# Patient Record
Sex: Female | Born: 1975
Health system: Southern US, Community
[De-identification: ages and names within clinical notes are randomized; demographics above are authoritative.]

## PROBLEM LIST (undated history)

## (undated) DIAGNOSIS — R51 Headache: Secondary | ICD-10-CM

## (undated) DIAGNOSIS — O24919 Unspecified diabetes mellitus in pregnancy, unspecified trimester: Secondary | ICD-10-CM

## (undated) DIAGNOSIS — O169 Unspecified maternal hypertension, unspecified trimester: Secondary | ICD-10-CM

## (undated) DIAGNOSIS — D649 Anemia, unspecified: Secondary | ICD-10-CM

---

## 2001-09-16 ENCOUNTER — Other Ambulatory Visit: Admission: RE | Admit: 2001-09-16 | Discharge: 2001-09-16 | Payer: Self-pay | Admitting: Obstetrics & Gynecology

## 2002-04-21 ENCOUNTER — Ambulatory Visit (HOSPITAL_COMMUNITY): Admission: RE | Admit: 2002-04-21 | Discharge: 2002-04-21 | Payer: Self-pay | Admitting: Internal Medicine

## 2002-04-21 ENCOUNTER — Encounter: Payer: Self-pay | Admitting: Internal Medicine

## 2002-04-27 ENCOUNTER — Encounter: Admission: RE | Admit: 2002-04-27 | Discharge: 2002-04-27 | Payer: Self-pay | Admitting: Internal Medicine

## 2002-04-27 ENCOUNTER — Encounter: Payer: Self-pay | Admitting: Internal Medicine

## 2002-09-22 ENCOUNTER — Other Ambulatory Visit: Admission: RE | Admit: 2002-09-22 | Discharge: 2002-09-22 | Payer: Self-pay | Admitting: Obstetrics & Gynecology

## 2003-10-03 ENCOUNTER — Other Ambulatory Visit: Admission: RE | Admit: 2003-10-03 | Discharge: 2003-10-03 | Payer: Self-pay | Admitting: Obstetrics & Gynecology

## 2004-09-30 ENCOUNTER — Ambulatory Visit (HOSPITAL_COMMUNITY): Admission: RE | Admit: 2004-09-30 | Discharge: 2004-09-30 | Payer: Self-pay | Admitting: Chiropractic Medicine

## 2005-08-19 DIAGNOSIS — O169 Unspecified maternal hypertension, unspecified trimester: Secondary | ICD-10-CM

## 2005-08-19 DIAGNOSIS — D649 Anemia, unspecified: Secondary | ICD-10-CM

## 2005-08-19 HISTORY — DX: Anemia, unspecified: D64.9

## 2005-08-19 HISTORY — PX: KNEE ARTHROSCOPY: SUR90

## 2005-08-19 HISTORY — DX: Unspecified maternal hypertension, unspecified trimester: O16.9

## 2005-11-04 ENCOUNTER — Other Ambulatory Visit: Admission: RE | Admit: 2005-11-04 | Discharge: 2005-11-04 | Payer: Self-pay | Admitting: Obstetrics and Gynecology

## 2006-02-07 ENCOUNTER — Ambulatory Visit (HOSPITAL_COMMUNITY): Admission: RE | Admit: 2006-02-07 | Discharge: 2006-02-07 | Payer: Self-pay | Admitting: Obstetrics and Gynecology

## 2006-04-11 ENCOUNTER — Inpatient Hospital Stay (HOSPITAL_COMMUNITY): Admission: AD | Admit: 2006-04-11 | Discharge: 2006-04-11 | Payer: Self-pay | Admitting: Obstetrics and Gynecology

## 2006-04-15 ENCOUNTER — Inpatient Hospital Stay (HOSPITAL_COMMUNITY): Admission: AD | Admit: 2006-04-15 | Discharge: 2006-04-19 | Payer: Self-pay | Admitting: Obstetrics and Gynecology

## 2006-04-16 ENCOUNTER — Encounter (INDEPENDENT_AMBULATORY_CARE_PROVIDER_SITE_OTHER): Payer: Self-pay | Admitting: Specialist

## 2006-04-20 ENCOUNTER — Encounter: Admission: RE | Admit: 2006-04-20 | Discharge: 2006-05-19 | Payer: Self-pay | Admitting: Obstetrics and Gynecology

## 2006-05-20 ENCOUNTER — Encounter: Admission: RE | Admit: 2006-05-20 | Discharge: 2006-06-19 | Payer: Self-pay | Admitting: Obstetrics and Gynecology

## 2006-06-20 ENCOUNTER — Encounter: Admission: RE | Admit: 2006-06-20 | Discharge: 2006-07-19 | Payer: Self-pay | Admitting: Obstetrics and Gynecology

## 2006-07-20 ENCOUNTER — Encounter: Admission: RE | Admit: 2006-07-20 | Discharge: 2006-08-19 | Payer: Self-pay | Admitting: Obstetrics and Gynecology

## 2006-08-01 ENCOUNTER — Ambulatory Visit (HOSPITAL_BASED_OUTPATIENT_CLINIC_OR_DEPARTMENT_OTHER): Admission: RE | Admit: 2006-08-01 | Discharge: 2006-08-01 | Payer: Self-pay | Admitting: Specialist

## 2006-11-09 ENCOUNTER — Emergency Department (HOSPITAL_COMMUNITY): Admission: EM | Admit: 2006-11-09 | Discharge: 2006-11-09 | Payer: Self-pay | Admitting: Emergency Medicine

## 2007-04-14 ENCOUNTER — Emergency Department (HOSPITAL_COMMUNITY): Admission: EM | Admit: 2007-04-14 | Discharge: 2007-04-14 | Payer: Self-pay | Admitting: Emergency Medicine

## 2011-01-04 NOTE — Op Note (Signed)
Emily Irwin, Emily Irwin          ACCOUNT NO.:  [id number]   MEDICAL RECORD NO.:  [id number]          PATIENT TYPE:  AMB   LOCATION:  NESC                         FACILITY:  [facility]   PHYSICIAN:  Jene Every, M.D.    DATE OF BIRTH:  11-Jul-1976   DATE OF PROCEDURE:  08/01/2006  DATE OF DISCHARGE:                               OPERATIVE REPORT   PREOPERATIVE DIAGNOSIS:  Medial meniscal tear, left knee.   POSTOPERATIVE DIAGNOSIS:  Medial meniscal tear, left knee,  chondromalacia patellofemoral joint.   PROCEDURE PERFORMED:  Left knee arthroscopy, posterior medial  meniscectomy, bucket handle tear, chondroplasty of the patellofemoral  joint.   ANESTHESIA:  General.   ASSISTANT:  None.   BRIEF HISTORY/INDICATIONS:  A 35 year old, bucket handle tear noted by  MRI.  Mechanical symptoms.  Operative intervention was indicated for  posterior medial meniscectomy.  The risks and benefits have been  discussed, including bleeding, infection, no change in symptoms,  worsening symptoms, need for repeat debridement in the future,  anesthetic complications, etc.   TECHNIQUE:  With the patient in the supine position, after induction of  adequate general anesthesia and 1 gm of Kefzol, the left lower extremity  was prepped and draped in the usual sterile fashion.  A lateral  parapatellar portal and the superior medial parapatellar portal was  fashioned with a #11 blade and egress cannula atraumatically placed.  Irrigant was utilized to insufflate the joint.  Under direct  visualization, a medial parapatellar portal was fashioned with a #11  blade after localization with an 18 gauge needle, sparing the medial  meniscus.  Noted immediately was a bucket handle tear of the medial  meniscus.  The anterior portion attached to the anterior aspect of the  medial meniscus.  Posterior portion of the posterior horn.  It was  truncated anteriorly.  The fragment was morcellized due to the  difficulty in  gaining access to the posterior horn.  I morselized with a  straight basket and rongeur, and then a 4.2, and then a 3.5 coude  shaver.  The remnant of the meniscus was also shaved.  It was the middle  half of the meniscus that was involved.  We were able to [facility] it  back to its posterior attachment, and the remnant was stable to probe  palpation.  There were minor grade 3 changes, of the patella, and  femoral sulcus was shaved.  Hypertrophic synovitis was shaved as well.  Exam of the lateral compartment was normal meniscus.  No tear.  Femoral  condyle and tibial plateau was unremarkable.  ACL and PCL were  unremarkable.  Normal patellofemoral tracking.  The gutters were  unremarkable, copiously lavaged.  Knee re-examined of medial meniscus.  No residual tear subluxating into the joint.  I copiously lavaged.  All  instrumentation was removed.  Portals were closed with 4-0 nylon and  simple sutures.  Marcaine 0.25% with epinephrine was infiltrated into  the joint wound sterilely.  Was awakened without difficulty and  transported to the recovery room in satisfactory condition.   Patient tolerated the procedure well with no complication.  Jene Every, M.D.  Electronically Signed     JB/MEDQ  D:  08/01/2006  T:  08/01/2006  Job:  [id number]

## 2011-01-04 NOTE — Discharge Summary (Signed)
NAMEGENELL, THEDE          ACCOUNT NO.:  [id number]   MEDICAL RECORD NO.:  [id number]          PATIENT TYPE:  INP   LOCATION:  9313                          FACILITY:  [facility]   PHYSICIAN:  Zenaida Niece, M.D.DATE OF BIRTH:  07/24/1976   DATE OF ADMISSION:  04/15/2006  DATE OF DISCHARGE:  04/19/2006                                 DISCHARGE SUMMARY   ADMISSION DIAGNOSES:  1. Intrauterine pregnancy at 37 weeks.  2. Preeclampsia.   DISCHARGE DIAGNOSES:  1. Intrauterine pregnancy at 37 weeks.  2. Preeclampsia.  3. Postpartum anemia with vaginal wall hematoma.   PROCEDURES:  On April 16, 2006, she had a spontaneous vaginal delivery with  a vaginal wall hematoma.   HISTORY AND PHYSICAL:  This is a 35 year old white female gravida 1, para 0  with an EGA of [redacted] weeks who presents for induction due to preeclampsia.  She  was seen in the office on the day of admission and found to have elevated  blood pressures and increased proteinuria.  Evaluation in triage revealed  persistent elevated blood pressures and proteinuria and she was admitted for  induction.  Prenatal care essentially uncomplicated other than the recent  elevated blood pressures and proteinuria.   PRENATAL LABORATORIES:  Blood type is A+ with a negative antibody screen.  Gonorrhea and chlamydia negative, RPR nonreactive, rubella immune, hepatitis  B surface antigen negative, HIV negative, group B strep negative.  She did  have an elevated risk of Down syndrome by screening, and the patient is a  cystic fibrosis carrier and dad is negative.   PAST HISTORY:  Essentially uncomplicated.   PHYSICAL EXAMINATION:  VITAL SIGNS:  Initial blood pressure here 136/83.  Fetal heart tracing is reactive with contractions every 5 minutes.  ABDOMEN:  Gravid, nontender.  EXTREMITIES:  The extremities have 2+ edema.  PELVIC:  The vaginal exam per Dr. Ellyn Hack 2, 50 and -2.   HOSPITAL COURSE:  The patient was admitted and placed  on magnesium for  seizure prophylaxis.  She was also placed on Pitocin for induction.  On the  afternoon of April 15, 2006, she progressed to 3-4 cm, and Dr. Ellyn Hack was  able to perform amniotomy also for induction.  She progressed slowly  throughout the night, and on the morning of August29, 2007 had a vaginal  delivery of a viable female infant with Apgars of 8 and 9 that weighed 9  pounds 5 ounces by Dr. Ambrose Mantle.  Delivery was performed over a left  mediolateral episiotomy.  Shoulder dystocia was encountered and treated with  McRoberts, wood screw and suprapubic pressure.  She did have uterine atony  which required Hemabate.  Her left mediolateral episiotomy was repaired with  3-0 Vicryl, and this did extend through the rectal sphincter which was re-  repaired with 2-0 Vicryl.  She also had an introital laceration repaired  with 3-0 Vicryl.  She then was noted to have a left vaginal wall hematoma  that remained stable at approximately 8 cm.  She was kept on magnesium  sulfate after delivery.  Hemoglobin fell from pre delivery of 9.7 to  immediately  post delivery of 7.4 and then 6.4.  Liver enzymes remained  slightly elevated.  Her vaginal wall hematoma remained stable.  She was kept  on magnesium sulfate until early on the morning of August31, 2007.  Blood  pressures remained stable, and she had excellent urine output.  Hemoglobin  on postpartum day #2 fell to 5.2.  She remained asymptomatic, and Dr. Ambrose Mantle  discussed this with the patient and she elected to avoid transfusion.  On  the afternoon of August31, 2007, she had one elevated blood pressure, and  PIH labs were repeated which revealed a hemoglobin of 6.2 and again slightly  elevated liver enzymes.  Repeat blood pressure was normal.  Blood pressures  remained stable.  On the morning of postpartum day #3, hemoglobin was 5.8  and AST was 67, ALT was 51 and these were stable.  She was having excellent  urine output.  She was felt to be  stable enough for discharge home at this  point.  The baby did end up being transferred to the neonatal intensive care  unit due to low blood sugars.   DISCHARGE INSTRUCTIONS:  Regular diet, pelvic rest, follow-up in 1 week for  blood pressure check.   MEDICATIONS:  1. Percocet #30 one to two p.o. q.4-6h. hours p.r.n. pain.  2. Over-the-counter ibuprofen as needed.  3. Over-the-counter iron b.i.d.  4. Over-the-counter stool softeners daily.  5. She is also given our discharge pamphlet.      Zenaida Niece, M.D.  Electronically Signed     TDM/MEDQ  D:  04/19/2006  T:  04/21/2006  Job:  [id number]

## 2011-06-11 LAB — OB RESULTS CONSOLE RPR: RPR: NONREACTIVE

## 2011-06-11 LAB — OB RESULTS CONSOLE HIV ANTIBODY (ROUTINE TESTING): HIV: NONREACTIVE

## 2011-06-11 LAB — OB RESULTS CONSOLE ABO/RH: RH Type: POSITIVE

## 2011-06-11 LAB — OB RESULTS CONSOLE HEPATITIS B SURFACE ANTIGEN: Hepatitis B Surface Ag: NEGATIVE

## 2011-06-11 LAB — OB RESULTS CONSOLE RUBELLA ANTIBODY, IGM: Rubella: IMMUNE

## 2011-11-06 ENCOUNTER — Encounter: Payer: BC Managed Care – PPO | Attending: Obstetrics and Gynecology | Admitting: Dietician

## 2011-11-06 DIAGNOSIS — O99810 Abnormal glucose complicating pregnancy: Secondary | ICD-10-CM | POA: Insufficient documentation

## 2011-11-06 DIAGNOSIS — Z713 Dietary counseling and surveillance: Secondary | ICD-10-CM | POA: Insufficient documentation

## 2011-11-07 ENCOUNTER — Encounter: Payer: Self-pay | Admitting: Dietician

## 2011-11-07 NOTE — Progress Notes (Signed)
  Patient was seen on 11/06/2011 for Gestational Diabetes self-management class at the [facility]. The following learning objectives were met by the patient during this course:   States the definition of Gestational Diabetes  States why dietary management is important in controlling blood glucose  Describes the effects each nutrient has on blood glucose levels  Demonstrates ability to create a balanced meal plan  Demonstrates carbohydrate counting   States when to check blood glucose levels  Demonstrates proper blood glucose monitoring techniques  States the effect of stress and exercise on blood glucose levels  States the importance of limiting caffeine and abstaining from alcohol and smoking  Blood glucose monitor given: Has her own glucose meter and has been monitoring for a week or so prior to coming to class Blood glucose reading: Fasting today was reported at 92 mg/dl  Patient instructed to monitor glucose levels:Fasting and 2 hours after the first bite of each meal FBS: 60 - <90 2 hour: <120  Patient received handouts:  Nutrition Diabetes and Pregnancy  Carbohydrate Counting List  Patient will be seen for follow-up as needed.

## 2011-11-19 ENCOUNTER — Encounter: Payer: Self-pay | Admitting: Obstetrics and Gynecology

## 2011-12-26 ENCOUNTER — Encounter (HOSPITAL_COMMUNITY): Payer: Self-pay

## 2012-01-06 ENCOUNTER — Encounter (HOSPITAL_COMMUNITY): Payer: Self-pay

## 2012-01-07 ENCOUNTER — Encounter (HOSPITAL_COMMUNITY): Payer: Self-pay

## 2012-01-07 ENCOUNTER — Encounter (HOSPITAL_COMMUNITY)
Admission: RE | Admit: 2012-01-07 | Discharge: 2012-01-07 | Disposition: A | Discharge status: Home / Self Care | Payer: BC Managed Care – PPO | Source: Ambulatory Visit | Attending: Obstetrics and Gynecology | Admitting: Obstetrics and Gynecology

## 2012-01-07 HISTORY — DX: Unspecified maternal hypertension, unspecified trimester: O16.9

## 2012-01-07 HISTORY — DX: Unspecified diabetes mellitus in pregnancy, unspecified trimester: O24.919

## 2012-01-07 HISTORY — DX: Headache: R51

## 2012-01-07 HISTORY — DX: Anemia, unspecified: D64.9

## 2012-01-07 LAB — SURGICAL PCR SCREEN: MRSA, PCR: NEGATIVE

## 2012-01-07 LAB — CBC
MCHC: 33.1 g/dL (ref 30.0–36.0)
RDW: 13 % (ref 11.5–15.5)

## 2012-01-07 LAB — RPR: RPR Ser Ql: NONREACTIVE

## 2012-01-07 NOTE — Pre-Procedure Instructions (Addendum)
Patient is expressing anxiety and concerns about infant not being monitored closely for blood sugar after birth. With her first baby, she states that his low blood sugar wasn't detected until he was showing symptoms and was sick.

## 2012-01-07 NOTE — Patient Instructions (Addendum)
YOUR PROCEDURE IS SCHEDULED ON: 01/14/12  ENTER THROUGH THE MAIN ENTRANCE OF [facility] AT:0600 am  USE DESK PHONE AND DIAL [id number] TO INFORM us OF YOUR ARRIVAL  CALL [phone] IF YOU HAVE ANY QUESTIONS OR PROBLEMS PRIOR TO YOUR ARRIVAL.  REMEMBER: DO NOT EAT OR DRINK AFTER MIDNIGHT :Monday  SPECIAL INSTRUCTIONS: water ok until 3:30 am   YOU MAY BRUSH YOUR TEETH THE MORNING OF SURGERY   TAKE THESE MEDICINES THE DAY OF SURGERY WITH SIP OF WATER:none   DO NOT WEAR JEWELRY, EYE MAKEUP, LIPSTICK OR DARK FINGERNAIL POLISH DO NOT WEAR BODY LOTIONS  DO NOT SHAVE FOR 48 HOURS PRIOR TO SURGERY  YOU WILL NOT BE ALLOWED TO DRIVE YOURSELF HOME.  NAME OF DRIVER:[employer]

## 2012-01-09 ENCOUNTER — Other Ambulatory Visit: Payer: Self-pay | Admitting: Obstetrics and Gynecology

## 2012-01-13 MED ORDER — DEXTROSE 5 % IV SOLN
2.0000 g | INTRAVENOUS | Status: DC
  Filled 2012-01-13: qty 2

## 2012-01-14 ENCOUNTER — Inpatient Hospital Stay (HOSPITAL_COMMUNITY): Payer: BC Managed Care – PPO | Admitting: Anesthesiology

## 2012-01-14 ENCOUNTER — Encounter (HOSPITAL_COMMUNITY): Payer: Self-pay | Admitting: General Surgery

## 2012-01-14 ENCOUNTER — Encounter (HOSPITAL_COMMUNITY): Payer: Self-pay | Admitting: Anesthesiology

## 2012-01-14 ENCOUNTER — Encounter (HOSPITAL_COMMUNITY)
Admission: RE | Disposition: A | Discharge status: Home / Self Care | Payer: Self-pay | Source: Ambulatory Visit | Attending: Obstetrics and Gynecology

## 2012-01-14 ENCOUNTER — Inpatient Hospital Stay (HOSPITAL_COMMUNITY)
Admission: RE | Admit: 2012-01-14 | Discharge: 2012-01-16 | DRG: 370 | Disposition: A | Discharge status: Home / Self Care | Payer: BC Managed Care – PPO | Source: Ambulatory Visit | Attending: Obstetrics and Gynecology | Admitting: Obstetrics and Gynecology

## 2012-01-14 DIAGNOSIS — O9903 Anemia complicating the puerperium: Secondary | ICD-10-CM | POA: Diagnosis not present

## 2012-01-14 DIAGNOSIS — D62 Acute posthemorrhagic anemia: Secondary | ICD-10-CM | POA: Diagnosis not present

## 2012-01-14 DIAGNOSIS — O34219 Maternal care for unspecified type scar from previous cesarean delivery: Principal | ICD-10-CM | POA: Diagnosis present

## 2012-01-14 DIAGNOSIS — Z302 Encounter for sterilization: Secondary | ICD-10-CM

## 2012-01-14 DIAGNOSIS — O99814 Abnormal glucose complicating childbirth: Secondary | ICD-10-CM | POA: Diagnosis present

## 2012-01-14 SURGERY — Surgical Case
Anesthesia: Spinal | Site: Abdomen | Wound class: Clean Contaminated

## 2012-01-14 MED ORDER — DIPHENHYDRAMINE HCL 50 MG/ML IJ SOLN: 12.5000 mg | INTRAMUSCULAR | Status: DC | PRN

## 2012-01-14 MED ORDER — ONDANSETRON HCL 4 MG/2ML IJ SOLN
INTRAMUSCULAR | Status: AC
  Filled 2012-01-14: qty 2

## 2012-01-14 MED ORDER — BISACODYL 10 MG RE SUPP: 10.0000 mg | Freq: Every day | RECTAL | Status: DC | PRN

## 2012-01-14 MED ORDER — METHYLERGONOVINE MALEATE 0.2 MG/ML IJ SOLN
INTRAMUSCULAR | Status: AC
  Filled 2012-01-14: qty 1

## 2012-01-14 MED ORDER — EPHEDRINE 5 MG/ML INJ
INTRAVENOUS | Status: AC
  Filled 2012-01-14: qty 10

## 2012-01-14 MED ORDER — KETOROLAC TROMETHAMINE 30 MG/ML IJ SOLN
INTRAMUSCULAR | Status: AC
  Administered 2012-01-14: 30 mg via INTRAVENOUS
  Filled 2012-01-14: qty 1

## 2012-01-14 MED ORDER — PHENYLEPHRINE 40 MCG/ML (10ML) SYRINGE FOR IV PUSH (FOR BLOOD PRESSURE SUPPORT)
PREFILLED_SYRINGE | INTRAVENOUS | Status: AC
  Filled 2012-01-14: qty 5

## 2012-01-14 MED ORDER — MEPERIDINE HCL 25 MG/ML IJ SOLN: 6.2500 mg | INTRAMUSCULAR | Status: DC | PRN

## 2012-01-14 MED ORDER — LANOLIN HYDROUS EX OINT: 1.0000 "application " | TOPICAL_OINTMENT | CUTANEOUS | Status: DC | PRN

## 2012-01-14 MED ORDER — SCOPOLAMINE 1 MG/3DAYS TD PT72
MEDICATED_PATCH | TRANSDERMAL | Status: AC
  Administered 2012-01-14: 1.5 mg via TRANSDERMAL
  Filled 2012-01-14: qty 1

## 2012-01-14 MED ORDER — SODIUM CHLORIDE 0.9 % IJ SOLN: 3.0000 mL | Freq: Two times a day (BID) | INTRAMUSCULAR | Status: DC

## 2012-01-14 MED ORDER — MORPHINE SULFATE (PF) 0.5 MG/ML IJ SOLN
INTRAMUSCULAR | Status: DC | PRN
  Administered 2012-01-14: .15 mg via INTRATHECAL

## 2012-01-14 MED ORDER — METHYLERGONOVINE MALEATE 0.2 MG/ML IJ SOLN
INTRAMUSCULAR | Status: DC | PRN
  Administered 2012-01-14: 0.2 mg via INTRAMUSCULAR

## 2012-01-14 MED ORDER — SODIUM CHLORIDE 0.9 % IV SOLN
1.0000 ug/kg/h | INTRAVENOUS | Status: DC | PRN
  Filled 2012-01-14: qty 2.5

## 2012-01-14 MED ORDER — KETOROLAC TROMETHAMINE 30 MG/ML IJ SOLN: 30.0000 mg | Freq: Four times a day (QID) | INTRAMUSCULAR | Status: AC | PRN

## 2012-01-14 MED ORDER — LACTATED RINGERS IV SOLN
INTRAVENOUS | Status: DC
  Administered 2012-01-14: 07:00:00 via INTRAVENOUS

## 2012-01-14 MED ORDER — DEXTROSE 5 % IV SOLN
2.0000 g | INTRAVENOUS | Status: DC | PRN
  Administered 2012-01-14: 2 g via INTRAVENOUS

## 2012-01-14 MED ORDER — SODIUM CHLORIDE 0.9 % IJ SOLN: 3.0000 mL | INTRAMUSCULAR | Status: DC | PRN

## 2012-01-14 MED ORDER — OXYTOCIN 10 UNIT/ML IJ SOLN
INTRAMUSCULAR | Status: AC
  Filled 2012-01-14: qty 2

## 2012-01-14 MED ORDER — IBUPROFEN 600 MG PO TABS
600.0000 mg | ORAL_TABLET | Freq: Four times a day (QID) | ORAL | Status: DC
  Administered 2012-01-15 – 2012-01-16 (×7): 600 mg via ORAL
  Filled 2012-01-14 (×8): qty 1

## 2012-01-14 MED ORDER — NALOXONE HCL 0.4 MG/ML IJ SOLN: 0.4000 mg | INTRAMUSCULAR | Status: DC | PRN

## 2012-01-14 MED ORDER — ZOLPIDEM TARTRATE 5 MG PO TABS: 5.0000 mg | ORAL_TABLET | Freq: Every evening | ORAL | Status: DC | PRN

## 2012-01-14 MED ORDER — EPHEDRINE SULFATE 50 MG/ML IJ SOLN
INTRAMUSCULAR | Status: DC | PRN
  Administered 2012-01-14 (×2): 10 mg via INTRAVENOUS
  Administered 2012-01-14: 5 mg via INTRAVENOUS
  Administered 2012-01-14: 10 mg via INTRAVENOUS

## 2012-01-14 MED ORDER — GLYCOPYRROLATE 0.2 MG/ML IJ SOLN
INTRAMUSCULAR | Status: DC | PRN
  Administered 2012-01-14: 0.2 mg via INTRAVENOUS

## 2012-01-14 MED ORDER — FENTANYL CITRATE 0.05 MG/ML IJ SOLN: 25.0000 ug | INTRAMUSCULAR | Status: DC | PRN

## 2012-01-14 MED ORDER — CEFAZOLIN SODIUM 1-5 GM-% IV SOLN
1.0000 g | Freq: Three times a day (TID) | INTRAVENOUS | Status: AC
  Administered 2012-01-14: 1 g via INTRAVENOUS
  Filled 2012-01-14 (×2): qty 50

## 2012-01-14 MED ORDER — ONDANSETRON HCL 4 MG/2ML IJ SOLN: 4.0000 mg | INTRAMUSCULAR | Status: DC | PRN

## 2012-01-14 MED ORDER — TETANUS-DIPHTH-ACELL PERTUSSIS 5-2.5-18.5 LF-MCG/0.5 IM SUSP
0.5000 mL | Freq: Once | INTRAMUSCULAR | Status: AC
Start: 2012-01-15 — End: 2012-01-15
  Administered 2012-01-15: 0.5 mL via INTRAMUSCULAR
  Filled 2012-01-14: qty 0.5

## 2012-01-14 MED ORDER — OXYCODONE-ACETAMINOPHEN 5-325 MG PO TABS
1.0000 | ORAL_TABLET | ORAL | Status: DC | PRN
  Administered 2012-01-14 – 2012-01-15 (×2): 1 via ORAL
  Administered 2012-01-15 (×2): 2 via ORAL
  Administered 2012-01-15 (×2): 1 via ORAL
  Administered 2012-01-15 – 2012-01-16 (×4): 2 via ORAL
  Filled 2012-01-14: qty 2
  Filled 2012-01-14 (×3): qty 1
  Filled 2012-01-14 (×4): qty 2
  Filled 2012-01-14: qty 1
  Filled 2012-01-14: qty 2

## 2012-01-14 MED ORDER — WITCH HAZEL-GLYCERIN EX PADS: 1.0000 "application " | MEDICATED_PAD | CUTANEOUS | Status: DC | PRN

## 2012-01-14 MED ORDER — LACTATED RINGERS IV SOLN
INTRAVENOUS | Status: DC | PRN
  Administered 2012-01-14 (×3): via INTRAVENOUS

## 2012-01-14 MED ORDER — PRENATAL MULTIVITAMIN CH
1.0000 | ORAL_TABLET | Freq: Every day | ORAL | Status: DC
  Administered 2012-01-15 – 2012-01-16 (×2): 1 via ORAL
  Filled 2012-01-14 (×2): qty 1

## 2012-01-14 MED ORDER — SIMETHICONE 80 MG PO CHEW: 80.0000 mg | CHEWABLE_TABLET | ORAL | Status: DC | PRN

## 2012-01-14 MED ORDER — FENTANYL CITRATE 0.05 MG/ML IJ SOLN
INTRAMUSCULAR | Status: DC | PRN
  Administered 2012-01-14: 25 ug via INTRATHECAL

## 2012-01-14 MED ORDER — METOCLOPRAMIDE HCL 5 MG/ML IJ SOLN: 10.0000 mg | Freq: Three times a day (TID) | INTRAMUSCULAR | Status: DC | PRN

## 2012-01-14 MED ORDER — BUPIVACAINE IN DEXTROSE 0.75-8.25 % IT SOLN
INTRATHECAL | Status: DC | PRN
  Administered 2012-01-14: 1.6 mL via INTRATHECAL

## 2012-01-14 MED ORDER — BUPIVACAINE HCL (PF) 0.25 % IJ SOLN
INTRAMUSCULAR | Status: DC | PRN
  Administered 2012-01-14: 10 mL

## 2012-01-14 MED ORDER — NALBUPHINE HCL 10 MG/ML IJ SOLN
5.0000 mg | INTRAMUSCULAR | Status: DC | PRN
  Filled 2012-01-14: qty 1

## 2012-01-14 MED ORDER — FERROUS SULFATE 325 (65 FE) MG PO TABS
325.0000 mg | ORAL_TABLET | Freq: Two times a day (BID) | ORAL | Status: DC
  Administered 2012-01-15 – 2012-01-16 (×3): 325 mg via ORAL
  Filled 2012-01-14 (×3): qty 1

## 2012-01-14 MED ORDER — MENTHOL 3 MG MT LOZG: 1.0000 | LOZENGE | OROMUCOSAL | Status: DC | PRN

## 2012-01-14 MED ORDER — OXYTOCIN 10 UNIT/ML IJ SOLN
40.0000 [IU] | INTRAVENOUS | Status: DC | PRN
  Administered 2012-01-14: 40 [IU] via INTRAVENOUS

## 2012-01-14 MED ORDER — DIPHENHYDRAMINE HCL 25 MG PO CAPS: 25.0000 mg | ORAL_CAPSULE | ORAL | Status: DC | PRN

## 2012-01-14 MED ORDER — IBUPROFEN 600 MG PO TABS: 600.0000 mg | ORAL_TABLET | Freq: Four times a day (QID) | ORAL | Status: DC | PRN

## 2012-01-14 MED ORDER — SIMETHICONE 80 MG PO CHEW
80.0000 mg | CHEWABLE_TABLET | Freq: Three times a day (TID) | ORAL | Status: DC
  Administered 2012-01-14 – 2012-01-16 (×7): 80 mg via ORAL

## 2012-01-14 MED ORDER — ONDANSETRON HCL 4 MG PO TABS: 4.0000 mg | ORAL_TABLET | ORAL | Status: DC | PRN

## 2012-01-14 MED ORDER — MORPHINE SULFATE 0.5 MG/ML IJ SOLN
INTRAMUSCULAR | Status: AC
  Filled 2012-01-14: qty 10

## 2012-01-14 MED ORDER — POLYETHYLENE GLYCOL 3350 17 G PO PACK
17.0000 g | PACK | Freq: Every day | ORAL | Status: DC
  Administered 2012-01-15 – 2012-01-16 (×2): 17 g via ORAL
  Filled 2012-01-14 (×4): qty 1

## 2012-01-14 MED ORDER — KETOROLAC TROMETHAMINE 30 MG/ML IJ SOLN
30.0000 mg | Freq: Four times a day (QID) | INTRAMUSCULAR | Status: AC | PRN
  Administered 2012-01-14 (×2): 30 mg via INTRAVENOUS
  Filled 2012-01-14: qty 1

## 2012-01-14 MED ORDER — SCOPOLAMINE 1 MG/3DAYS TD PT72
1.0000 | MEDICATED_PATCH | Freq: Once | TRANSDERMAL | Status: DC
  Administered 2012-01-14: 1.5 mg via TRANSDERMAL

## 2012-01-14 MED ORDER — BUPIVACAINE HCL (PF) 0.25 % IJ SOLN
INTRAMUSCULAR | Status: AC
  Filled 2012-01-14: qty 30

## 2012-01-14 MED ORDER — LIDOCAINE HCL (CARDIAC) 20 MG/ML IV SOLN
INTRAVENOUS | Status: DC | PRN
  Administered 2012-01-14: 40 mg via INTRAVENOUS

## 2012-01-14 MED ORDER — METHYLERGONOVINE MALEATE 0.2 MG/ML IJ SOLN
0.2000 mg | Freq: Four times a day (QID) | INTRAMUSCULAR | Status: DC
  Filled 2012-01-14: qty 1

## 2012-01-14 MED ORDER — SODIUM CHLORIDE 0.9 % IV SOLN
250.0000 mL | INTRAVENOUS | Status: DC
  Administered 2012-01-15: 1000 mL via INTRAVENOUS

## 2012-01-14 MED ORDER — SENNOSIDES-DOCUSATE SODIUM 8.6-50 MG PO TABS
2.0000 | ORAL_TABLET | Freq: Every day | ORAL | Status: DC
  Administered 2012-01-14 – 2012-01-15 (×2): 2 via ORAL

## 2012-01-14 MED ORDER — PHENYLEPHRINE HCL 10 MG/ML IJ SOLN
INTRAMUSCULAR | Status: DC | PRN
  Administered 2012-01-14: 40 ug via INTRAVENOUS
  Administered 2012-01-14 (×4): 80 ug via INTRAVENOUS

## 2012-01-14 MED ORDER — FLEET ENEMA 7-19 GM/118ML RE ENEM: 1.0000 | ENEMA | Freq: Every day | RECTAL | Status: DC | PRN

## 2012-01-14 MED ORDER — DIPHENHYDRAMINE HCL 50 MG/ML IJ SOLN: 25.0000 mg | INTRAMUSCULAR | Status: DC | PRN

## 2012-01-14 MED ORDER — DIBUCAINE 1 % RE OINT: 1.0000 "application " | TOPICAL_OINTMENT | RECTAL | Status: DC | PRN

## 2012-01-14 MED ORDER — DIPHENHYDRAMINE HCL 25 MG PO CAPS: 25.0000 mg | ORAL_CAPSULE | Freq: Four times a day (QID) | ORAL | Status: DC | PRN

## 2012-01-14 MED ORDER — OXYTOCIN 20 UNITS IN LACTATED RINGERS INFUSION - SIMPLE
INTRAVENOUS | Status: AC
  Administered 2012-01-14: 20 [IU] via INTRAVENOUS
  Filled 2012-01-14: qty 1000

## 2012-01-14 MED ORDER — METHYLERGONOVINE MALEATE 0.2 MG PO TABS
0.2000 mg | ORAL_TABLET | Freq: Four times a day (QID) | ORAL | Status: DC
  Administered 2012-01-14 – 2012-01-15 (×2): 0.2 mg via ORAL
  Filled 2012-01-14 (×2): qty 1

## 2012-01-14 MED ORDER — ONDANSETRON HCL 4 MG/2ML IJ SOLN
INTRAMUSCULAR | Status: DC | PRN
  Administered 2012-01-14: 2 mg via INTRAVENOUS
  Administered 2012-01-14: 4 mg via INTRAVENOUS
  Administered 2012-01-14: 2 mg via INTRAVENOUS

## 2012-01-14 MED ORDER — ONDANSETRON HCL 4 MG/2ML IJ SOLN: 4.0000 mg | Freq: Three times a day (TID) | INTRAMUSCULAR | Status: DC | PRN

## 2012-01-14 MED ORDER — METOCLOPRAMIDE HCL 5 MG/ML IJ SOLN: 10.0000 mg | Freq: Once | INTRAMUSCULAR | Status: DC | PRN

## 2012-01-14 MED ORDER — OXYTOCIN 20 UNITS IN LACTATED RINGERS INFUSION - SIMPLE
125.0000 mL/h | INTRAVENOUS | Status: AC
  Administered 2012-01-14: 20 [IU] via INTRAVENOUS

## 2012-01-14 MED ORDER — FENTANYL CITRATE 0.05 MG/ML IJ SOLN
INTRAMUSCULAR | Status: AC
  Filled 2012-01-14: qty 2

## 2012-01-14 SURGICAL SUPPLY — 25 items
CHLORAPREP W/TINT 26ML (MISCELLANEOUS) ×3 IMPLANT
CLOTH BEACON ORANGE TIMEOUT ST (SAFETY) ×3 IMPLANT
CONTAINER PREFILL 10% NBF 15ML (MISCELLANEOUS) ×6 IMPLANT
DRSG COVADERM 4X10 (GAUZE/BANDAGES/DRESSINGS) ×3 IMPLANT
ELECT REM PT RETURN 9FT ADLT (ELECTROSURGICAL) ×3
ELECTRODE REM PT RTRN 9FT ADLT (ELECTROSURGICAL) ×2 IMPLANT
GLOVE BIO SURGEON STRL SZ 6.5 (GLOVE) ×3 IMPLANT
GLOVE BIOGEL PI IND STRL 7.0 (GLOVE) ×4 IMPLANT
GLOVE BIOGEL PI INDICATOR 7.0 (GLOVE) ×2
NEEDLE HYPO 25X1 1.5 SAFETY (NEEDLE) ×3 IMPLANT
NS IRRIG 1000ML POUR BTL (IV SOLUTION) ×6 IMPLANT
PACK C SECTION WH (CUSTOM PROCEDURE TRAY) ×3 IMPLANT
PAD ABD 7.5X8 STRL (GAUZE/BANDAGES/DRESSINGS) ×3 IMPLANT
STAPLER VISISTAT 35W (STAPLE) ×3 IMPLANT
SUT CHROMIC GUT AB #0 18 (SUTURE) ×3 IMPLANT
SUT MNCRL 0 VIOLET CTX 36 (SUTURE) ×6 IMPLANT
SUT MONOCRYL 0 CTX 36 (SUTURE) ×3
SUT PLAIN 2 0 XLH (SUTURE) ×3 IMPLANT
SUT VIC AB 0 CT1 27 (SUTURE) ×6
SUT VIC AB 0 CT1 27XBRD ANBCTR (SUTURE) ×4 IMPLANT
SUT VIC AB 2-0 CT1 27 (SUTURE) ×2
SUT VIC AB 2-0 CT1 TAPERPNT 27 (SUTURE) ×4 IMPLANT
SYR CONTROL 10ML LL (SYRINGE) ×3 IMPLANT
TOWEL OR 17X24 6PK STRL BLUE (TOWEL DISPOSABLE) ×6 IMPLANT
TRAY FOLEY CATH 14FR (SET/KITS/TRAYS/PACK) ×3 IMPLANT

## 2012-01-14 NOTE — Anesthesia Procedure Notes (Signed)
Spinal  Patient location during procedure: OR Start time: 01/14/2012 7:36 AM Staffing Anesthesiologist: Vinnie Gombert A. Performed by: anesthesiologist  Preanesthetic Checklist Completed: patient identified, site marked, surgical consent, pre-op evaluation, timeout performed, IV checked, risks and benefits discussed and monitors and equipment checked Spinal Block Patient position: sitting Prep: site prepped and draped and DuraPrep Patient monitoring: heart rate, cardiac monitor, continuous pulse ox and blood pressure Approach: midline Location: L3-4 Injection technique: single-shot Needle Needle type: Sprotte  Needle gauge: 24 G Needle length: 9 cm Assessment Sensory level: T6 Events: paresthesia Additional Notes Patient tolerated procedure well. Transient paresthesia right leg. Adequate sensory level.

## 2012-01-14 NOTE — Transfer of Care (Signed)
Immediate Anesthesia Transfer of Care Note  Patient: Emily Irwin  Procedure(s) Performed: Procedure(s) (LRB): CESAREAN SECTION WITH BILATERAL TUBAL LIGATION ()  Patient Location: Mother/Baby  Anesthesia Type: Spinal  Level of Consciousness: awake, alert  and oriented  Airway & Oxygen Therapy: Patient Spontanous Breathing  Post-op Assessment: Report given to PACU RN and Post -op Vital signs reviewed and stable  Post vital signs: Reviewed and stable  Complications: No apparent anesthesia complications

## 2012-01-14 NOTE — Anesthesia Postprocedure Evaluation (Signed)
Anesthesia Post Note  Patient: Emily Irwin  Procedure(s) Performed: Procedure(s) (LRB): CESAREAN SECTION WITH BILATERAL TUBAL LIGATION ()  Anesthesia type: Spinal  Patient location: PACU  Post pain: Pain level controlled  Post assessment: Post-op Vital signs reviewed  Last Vitals:  Filed Vitals:   01/14/12 0930  BP: 111/61  Pulse: 77  Temp:   Resp: 20    Post vital signs: Reviewed  Level of consciousness: awake  Complications: No apparent anesthesia complications

## 2012-01-14 NOTE — Addendum Note (Signed)
Addendum  created 01/14/12 1706 by Christene Lye, CRNA   Modules edited:Anesthesia Events, Notes Section

## 2012-01-14 NOTE — Anesthesia Preprocedure Evaluation (Signed)
Anesthesia Evaluation  Patient identified by MRN, date of birth, ID band Patient awake    Reviewed: Allergy & Precautions, H&P , NPO status , Patient's Chart, lab work & pertinent test results  Airway Mallampati: II TM Distance: >3 FB Neck ROM: Full    Dental No notable dental hx. (+) Teeth Intact   Pulmonary neg pulmonary ROS,  breath sounds clear to auscultation  Pulmonary exam normal       Cardiovascular hypertension, negative cardio ROS  Rhythm:Regular Rate:Normal  Hx/o PIH   Neuro/Psych  Headaches, negative psych ROS   GI/Hepatic negative GI ROS, Neg liver ROS,   Endo/Other  Diabetes mellitus-, Well Controlled, Gestational  Renal/GU negative Renal ROS     Musculoskeletal   Abdominal Normal abdominal exam  (+)   Peds  Hematology negative hematology ROS (+)   Anesthesia Other Findings   Reproductive/Obstetrics (+) Pregnancy                           Anesthesia Physical Anesthesia Plan  ASA: II  Anesthesia Plan: Spinal   Post-op Pain Management:    Induction:   Airway Management Planned:   Additional Equipment:   Intra-op Plan:   Post-operative Plan:   Informed Consent: I have reviewed the patients History and Physical, chart, labs and discussed the procedure including the risks, benefits and alternatives for the proposed anesthesia with the patient or authorized representative who has indicated his/her understanding and acceptance.     Plan Discussed with: Anesthesiologist, CRNA and Surgeon  Anesthesia Plan Comments:         Anesthesia Quick Evaluation

## 2012-01-14 NOTE — Brief Op Note (Signed)
01/14/2012  8:38 AM  PATIENT:  Emily Irwin  36 y.o. female  PRE-OPERATIVE DIAGNOSIS:  Previous shoulder dystocia, term gestation, desires sterilization, Class A1 GDM  POST-OPERATIVE DIAGNOSIS:  Focal placenta accreta, previous shoulder dystocia, term gestation, Class A1 GDM, desires sterilization  PROCEDURE:  Procedure(s) (LRB):Primary  CESAREAN SECTION (N/A), Sharl Ma hysterotomy, Modified Pomeroy Tubal ligation SURGEON:  Surgeon(s) and Role:    * Velma Hanna Cathie Beams, MD - Primary  PHYSICIAN ASSISTANT:   ASSISTANTS: Marlinda Mike, CNM   ANESTHESIA:   spinal  FINDINGS: Live female w/ loop of cord presenting and around body, nl tubes and ovaries, Apgar 8/9. Focal accreta ant wall x 2 removed  EBL:  Total I/O In: 2000 [I.V.:2000] Out: 1100 [Urine:200; Blood:900]  BLOOD ADMINISTERED:none  DRAINS: none   LOCAL MEDICATIONS USED:  MARCAINE     SPECIMEN:  Source of Specimen:  portion of right and left fallopian tubes sent, placenta not sent  DISPOSITION OF SPECIMEN:  PATHOLOGY  COUNTS:  YES  TOURNIQUET:  * No tourniquets in log *  DICTATION: .Other Dictation: Dictation Number [id number]  PLAN OF CARE: Admit to inpatient   PATIENT DISPOSITION:  PACU - hemodynamically stable.   Delay start of Pharmacological VTE agent (>24hrs) due to surgical blood loss or risk of bleeding: no

## 2012-01-14 NOTE — Transfer of Care (Signed)
Immediate Anesthesia Transfer of Care Note  Patient: Emily Irwin  Procedure(s) Performed: Procedure(s) (LRB): CESAREAN SECTION (N/A)  Patient Location: PACU  Anesthesia Type: Spinal  Level of Consciousness: awake, alert  and oriented  Airway & Oxygen Therapy: Patient Spontanous Breathing  Post-op Assessment: Report given to PACU RN and Post -op Vital signs reviewed and stable  Post vital signs: Reviewed and stable  Complications: No apparent anesthesia complications

## 2012-01-14 NOTE — Op Note (Signed)
NAMELAKEIDRA, Emily Irwin NO.:  [id number]  MEDICAL RECORD NO.:  [id number]  LOCATION:  9126                          FACILITY:  [facility]  PHYSICIAN:  Maxie Better, M.D.DATE OF BIRTH:  [birth year]  DATE OF PROCEDURE:  01/14/2012 DATE OF DISCHARGE:                              OPERATIVE REPORT   PREOPERATIVE DIAGNOSES:  Previous shoulder dystocia, term gestation, class A1 gestational diabetes, desires sterilization.  POSTOPERATIVE DIAGNOSES:  Focal placenta accreta, class A1 gestational diabetes, previous shoulder dystocia, term gestation, desires sterilization.  PROCEDURE:  Primary cesarean section, Sharl Ma hysterotomy, modified Pomeroy tubal ligation.  ANESTHESIA:  Spinal.  SURGEON:  Maxie Better, MD  ASSISTANT:  Marlinda Mike, CNM  PROCEDURE:  Under adequate spinal anesthesia, the patient was placed in the supine position with a left lateral tilt.  She was sterilely prepped and draped in usual fashion.  Indwelling Foley catheter was sterilely placed.  A 0.25% Marcaine was injected at the planned Pfannenstiel incision site.  Pfannenstiel skin incision was then made, carried down to the rectus fascia.  Rectus fascia was opened transversely.  The rectus fascia was then bluntly and sharply dissected off the rectus muscle in superior and inferior fashion.  The rectus muscle was split in the midline.  The parietal peritoneum was entered sharply and extended. The vesicouterine peritoneum was opened transversely.  There was a large amount of vessels in the lower uterine segment.  The bladder was then bluntly dissected off the lower uterine segment and displaced inferiorly with a bladder retractor.  A curvilinear lower uterine transverse incision was made and extended with bandage scissors.  Artificial rupture of membranes occurred.  Subsequent loop of cord was noted in front of the head.  The fetal head was then replaced back in the uterine cavity and the cord  moved up superiorly.  The delivery was then re- attempted with subsequent delivery of a live female with cord around the body.  The cord was clamped, cut.  The baby was bulb suctioned on the abdomen.  The baby was transferred to the awaiting pediatrician,who subsequently assigned Apgars of 8 and 9 at 1 and 5 minutes.  The uterus was incised internally.  The placenta, however, was focally partially adherent to anterior uterine wall.  The patient has a history of postpartum hemorrhage with a prior delivery.  Therefore, the uterus was exteriorized with the placenta in place.  Double dose of intravenous Pitocin was infusing. The placenta was then manually removed.  As it was being removed, it was noted that part of the cotyledon was adherent to the myometrium and the placenta was finally removed. Inspection no of uterine cavity showed focal area of placental tissue x 2 noted adherent as well anteriorly.  Both of these with the exteriorization of the uterus was able to be seen and removed. The defect of the bigger size area in the myometrium was closed with 2-0 Vicryl interrupted sutures.  The cavity was explored.  No other areas of concern was noted.  At that point, the uterus was then returned back to the abdomen.  Uterine incision had no extension.  Normal tubes and ovaries were noted bilaterally.  The uterine incision was then closed in 2 layers, the  first layer with 0 Monocryl in a running locked stitch, second layer was imbricated using 0 Monocryl suture.  The abdomen was then copiously irrigated and suctioned of debris.  The ovaries were again noted to be normal.  The midportion of the left fallopian tube was grasped with a Babcock clamp.  The underlying mesosalpinx was opened with cautery.  The proximal and distal portion of the tube was then tied with 0 chromic sutures proximally and distally x2, and the same procedure was performed on the contralateral side with the midportion of both  fallopian tubes were then being removed.  The good hemostasis was noted on the uterine incision.  The parietal peritoneum was then closed with 2-0 Vicryl.  The rectus fascia was closed with 0 Vicryl x2.  The subcutaneous areas were irrigated, small bleeders were cauterized. Interrupted 2-0 plain suture was placed and the skin approximated with Ethicon staples.  Specimen labelled portion of right and left fallopian tubes were sent to pathology.  The placenta was not sent.  Estimated blood loss was 900 mL.  Intraoperative fluid was 2 liters.  Urine output was 200 mL, clear yellow urine.  Sponge and instrument counts x2 was correct.  Complications none.  The patient tolerated the procedure well, was transferred to recovery room in stable condition.     Maxie Better, M.D.     [place]/MEDQ  D:  01/14/2012  T:  01/14/2012  Job:  [id number]

## 2012-01-15 LAB — CBC
MCHC: 33.7 g/dL (ref 30.0–36.0)
RDW: 13.4 % (ref 11.5–15.5)
WBC: 8.3 10*3/uL (ref 4.0–10.5)

## 2012-01-15 MED ORDER — CEFAZOLIN SODIUM 1-5 GM-% IV SOLN
1.0000 g | Freq: Once | INTRAVENOUS | Status: AC
  Administered 2012-01-15: 1 g via INTRAVENOUS
  Filled 2012-01-15: qty 50

## 2012-01-15 NOTE — Progress Notes (Signed)
Patient ID: Emily Irwin, female   DOB: [birth year], 36 y.o.   MRN: [id number]  POSTOPERATIVE DAY # 1 S/P cesarean section with BTL   S:         Reports feeling well -mild itching only             Tolerating po intake / no nausea / no vomiting / no flatus / no BM             Bleeding is light             Pain controlled with motrin and percocet             Up ad lib / ambulatory  Newborn breast-feeding     O:  A & O x 3 NAD    VS: Blood pressure 122/63, pulse 69, temperature 98.3 F (36.8 C), temperature source Oral, resp. rate 20, weight 93.441 kg (206 lb), last menstrual period 04/13/2011, SpO2 96.00%, unknown if currently breastfeeding.   LABS: WBC/Hgb/Hct/Plts:  8.3/8.7/25.8/144 (05/29 0500)    Lungs: Clear and unlabored  Heart: regular rate and rhythm / no mumurs  Abdomen: soft, non-tender, non-distended, hypoactive BS             Fundus: firm, non-tender, U-1             Dressing intact without drainage              Perineum: no edema  Lochia: light  Extremities: trace edema, no calf pain or tenderness, negative Homans  A:        POD # 1 S/P cesarean section with BTL            Acute blood loss anemia  P:        Routine postoperative care              Start iron and colace             Consider early discharge     Marlinda Mike 01/15/2012, 9:21 AM

## 2012-01-16 ENCOUNTER — Encounter (HOSPITAL_COMMUNITY)
Admission: RE | Admit: 2012-01-16 | Discharge: 2012-01-16 | Disposition: A | Discharge status: Home / Self Care | Payer: BC Managed Care – PPO | Source: Ambulatory Visit | Attending: Obstetrics and Gynecology | Admitting: Obstetrics and Gynecology

## 2012-01-16 DIAGNOSIS — O923 Agalactia: Secondary | ICD-10-CM | POA: Insufficient documentation

## 2012-01-16 MED ORDER — IBUPROFEN 600 MG PO TABS: 600.0000 mg | ORAL_TABLET | Freq: Four times a day (QID) | ORAL | Status: AC

## 2012-01-16 MED ORDER — FERROUS SULFATE 325 (65 FE) MG PO TABS: 325.0000 mg | ORAL_TABLET | Freq: Two times a day (BID) | ORAL | Status: DC

## 2012-01-16 MED ORDER — OXYCODONE-ACETAMINOPHEN 5-325 MG PO TABS: 1.0000 | ORAL_TABLET | ORAL | Status: AC | PRN

## 2012-01-16 NOTE — Progress Notes (Signed)
Patient ID: ZI SEK, female   DOB: 1976/08/03, 36 y.o.   MRN: [id number]  POSTOPERATIVE DAY # 2 S/P cesarean section   S:         Reports feeling well - desires discharge today             Tolerating po intake / no nausea / no vomiting / + flatus / no BM             Bleeding is light             Pain controlled with motrin and percocet             Up ad lib / ambulatory  Newborn breast feeding     O:  A & O x 3  NAD             VS: Blood pressure 120/75, pulse 80, temperature 98 F (36.7 C), temperature source Oral, resp. rate 18, weight 93.441 kg (206 lb), last menstrual period 04/13/2011, SpO2 97.00%, unknown if currently breastfeeding.  Lungs: Clear and unlabored  Heart: regular rate and rhythm / no mumurs  Abdomen: soft, non-tender, non-distended, active BS             Fundus: firm, non-tender, U-1             Dressing OFF              Incision:  approximated with staples / no erythema / no ecchymosis / no drainage  Perineum: no edema  Lochia: light  Extremities: trace edema, no calf pain or tenderness, negative Homans  A:        POD # 2 S/P cesarean section with BTL            Stable status  P:        Routine postoperative care              Discharge home     Marlinda Mike CNM,MSN 01/16/2012, 9:29 AM

## 2012-01-16 NOTE — Discharge Summary (Signed)
POSTOPERATIVE DISCHARGE SUMMARY:  Patient ID: Emily Irwin MRN: [id number] DOB/AGE: 26-Jun-1976 35 y.o.  Admit date: 01/14/2012 Discharge date:  01/16/2012  Admission Diagnoses:  39 weeks previous cesarean section - elective repeat / undesired fertility    Discharge Diagnoses:   Term Pregnancy-delivered and POD cesarean section & BTL  Prenatal history: G2P1002   EDC : 01/18/2012, by Last Menstrual Period  Prenatal care at [facility]  Primary provider : Cousins Prenatal course complicated by previous cesarean section  Prenatal Labs: ABO, Rh: A (10/23 1300) positive Antibody: Negative (10/23 1300) Rubella: Immune (10/23 1300)  RPR: NON REACTIVE (05/21 0915)  HBsAg: Negative (10/23 1300)  HIV: Non-reactive (10/23 1300)  GBS:   negative 1 hr Glucola : nl   Medical / Surgical History :  Past medical history:  Past Medical History  Diagnosis Date  . Diabetes in pregnancy     DIET CONTROLLED  . Hypertension in pregnancy 2007  . Anemia 2007    hemorrhaged during delivery  . Headache     migraines    Past surgical history:  Past Surgical History  Procedure Date  . Knee arthroscopy 2007    Family History:  Family History  Problem Relation Age of Onset  . Diabetes Maternal Grandmother   . Diabetes Paternal Grandfather     Social History:  reports that she has never smoked. She has never used smokeless tobacco. She reports that she does not drink alcohol or use illicit drugs.   Allergies: Review of patient's allergies indicates no known allergies.    Current Medications at time of admission:  Prior to Admission medications   Medication Sig Start Date End Date Taking? Authorizing Provider  acetaminophen (TYLENOL) 500 MG tablet Take 1,000 mg by mouth daily as needed. Back pain   Yes Historical Provider, MD  polyethylene glycol (MIRALAX / GLYCOLAX) packet Take 17 g by mouth daily.   Yes Historical Provider, MD  Prenatal Vit-DSS-Fe Cbn-FA  (PRENATAL AD PO) Take 1 tablet by mouth daily.   Yes Historical Provider, MD  calcium carbonate (TUMS EX) 750 MG chewable tablet Chew 2 tablets by mouth 2 (two) times daily as needed. heartburn    Historical Provider, MD     Intrapartum Course: none  Procedures: Cesarean section delivery of female newborn by Dr Cherly Hensen  See operative report for further details  Postoperative / postpartum course: uneventful  Physical Exam:   VSS: Blood pressure 120/75, pulse 80, temperature 98 F (36.7 C), temperature source Oral, resp. rate 18, weight 93.441 kg (206 lb), last menstrual period 04/13/2011, SpO2 97.00%, unknown if currently breastfeeding.  LABS:   preop hgb 11.7 / post hgb 8.7  General: NAD / pleasant Heart: RR Lungs:clear Abdomen:soft / mildly distended / active BS Extremities:trace edema   Incision:  approximated with staples / no erythema / no ecchymosis / no drainage Staples: intact for removal at [facility] Monday ( Day 6)  Discharge Instructions:  Discharged Condition: stable Activity: pelvic rest Diet: routine Medications: see below Medication List  As of 01/16/2012  9:32 AM   TAKE these medications         acetaminophen 500 MG tablet   Commonly known as: TYLENOL   Take 1,000 mg by mouth daily as needed. Back pain      calcium carbonate 750 MG chewable tablet   Commonly known as: TUMS EX   Chew 2 tablets by mouth 2 (two) times daily as needed. heartburn      ibuprofen 600  MG tablet   Commonly known as: ADVIL,MOTRIN   Take 1 tablet (600 mg total) by mouth every 6 (six) hours.      oxyCODONE-acetaminophen 5-325 MG per tablet   Commonly known as: PERCOCET   Take 1-2 tablets by mouth every 3 (three) hours as needed (moderate - severe pain).      polyethylene glycol packet   Commonly known as: MIRALAX / GLYCOLAX   Take 17 g by mouth daily.      PRENATAL AD PO   Take 1 tablet by mouth daily.      ZOFRAN 4 MG tablet   Generic drug: ondansetron   Take 4 mg by mouth  every 12 (twelve) hours as needed. nausea                     Iron 325 mg (OTC tablet) twice daily x 6 weeks            Colace 100 mg (OTC) 1-2 times daily for stool softener with iron       Condition: stable Postpartum Instructions: refer to practice specific booklet Discharge to: home Disposition:  Follow up :  Follow-up Information    Follow up with COUSINS,SHERONETTE A, MD. Schedule an appointment as soon as possible for a visit in 6 weeks. (staple removal at [facility] Monday)    Contact information:   [address] [place] [address] [phone]           Signed: Marlinda Mike CNM,MSN 01/16/2012, 9:32 AM

## 2012-02-16 ENCOUNTER — Encounter (HOSPITAL_COMMUNITY)
Admission: RE | Admit: 2012-02-16 | Discharge: 2012-02-16 | Disposition: A | Discharge status: Home / Self Care | Payer: BC Managed Care – PPO | Source: Ambulatory Visit | Attending: Obstetrics and Gynecology | Admitting: Obstetrics and Gynecology

## 2012-02-16 DIAGNOSIS — O923 Agalactia: Secondary | ICD-10-CM | POA: Insufficient documentation

## 2012-03-18 ENCOUNTER — Encounter (HOSPITAL_COMMUNITY)
Admission: RE | Admit: 2012-03-18 | Discharge: 2012-03-18 | Disposition: A | Discharge status: Home / Self Care | Payer: BC Managed Care – PPO | Source: Ambulatory Visit | Attending: Obstetrics and Gynecology | Admitting: Obstetrics and Gynecology

## 2012-03-18 DIAGNOSIS — O923 Agalactia: Secondary | ICD-10-CM | POA: Insufficient documentation

## 2012-04-18 ENCOUNTER — Encounter (HOSPITAL_COMMUNITY)
Admission: RE | Admit: 2012-04-18 | Discharge: 2012-04-18 | Disposition: A | Discharge status: Home / Self Care | Payer: BC Managed Care – PPO | Source: Ambulatory Visit | Attending: Obstetrics and Gynecology | Admitting: Obstetrics and Gynecology

## 2012-04-18 DIAGNOSIS — O923 Agalactia: Secondary | ICD-10-CM | POA: Insufficient documentation

## 2012-05-19 ENCOUNTER — Encounter (HOSPITAL_COMMUNITY)
Admission: RE | Admit: 2012-05-19 | Discharge: 2012-05-19 | Disposition: A | Discharge status: Home / Self Care | Payer: BC Managed Care – PPO | Source: Ambulatory Visit | Attending: Obstetrics and Gynecology | Admitting: Obstetrics and Gynecology

## 2012-05-19 DIAGNOSIS — O923 Agalactia: Secondary | ICD-10-CM | POA: Insufficient documentation

## 2012-06-19 ENCOUNTER — Encounter (HOSPITAL_COMMUNITY)
Admission: RE | Admit: 2012-06-19 | Discharge: 2012-06-19 | Disposition: A | Discharge status: Home / Self Care | Payer: BC Managed Care – PPO | Source: Ambulatory Visit | Attending: Obstetrics and Gynecology | Admitting: Obstetrics and Gynecology

## 2012-06-19 DIAGNOSIS — O923 Agalactia: Secondary | ICD-10-CM | POA: Insufficient documentation

## 2012-07-19 ENCOUNTER — Encounter (HOSPITAL_COMMUNITY)
Admission: RE | Admit: 2012-07-19 | Discharge: 2012-07-19 | Disposition: A | Discharge status: Home / Self Care | Payer: BC Managed Care – PPO | Source: Ambulatory Visit | Attending: Obstetrics and Gynecology | Admitting: Obstetrics and Gynecology

## 2012-07-19 DIAGNOSIS — O923 Agalactia: Secondary | ICD-10-CM | POA: Insufficient documentation

## 2014-06-20 ENCOUNTER — Encounter (HOSPITAL_COMMUNITY): Payer: Self-pay | Admitting: General Surgery

## 2014-07-11 ENCOUNTER — Encounter: Payer: Self-pay | Admitting: Physician Assistant

## 2014-07-11 ENCOUNTER — Ambulatory Visit (INDEPENDENT_AMBULATORY_CARE_PROVIDER_SITE_OTHER): Payer: BC Managed Care – PPO | Admitting: Physician Assistant

## 2014-07-11 VITALS — BP 134/65 | HR 85 | Ht 66.0 in | Wt 192.0 lb

## 2014-07-11 DIAGNOSIS — O139 Gestational [pregnancy-induced] hypertension without significant proteinuria, unspecified trimester: Secondary | ICD-10-CM | POA: Insufficient documentation

## 2014-07-11 DIAGNOSIS — Z131 Encounter for screening for diabetes mellitus: Secondary | ICD-10-CM

## 2014-07-11 DIAGNOSIS — E669 Obesity, unspecified: Secondary | ICD-10-CM | POA: Diagnosis not present

## 2014-07-11 DIAGNOSIS — O24410 Gestational diabetes mellitus in pregnancy, diet controlled: Secondary | ICD-10-CM | POA: Diagnosis not present

## 2014-07-11 DIAGNOSIS — Z23 Encounter for immunization: Secondary | ICD-10-CM

## 2014-07-11 DIAGNOSIS — Z0000 Encounter for general adult medical examination without abnormal findings: Secondary | ICD-10-CM | POA: Diagnosis not present

## 2014-07-11 DIAGNOSIS — O24419 Gestational diabetes mellitus in pregnancy, unspecified control: Secondary | ICD-10-CM | POA: Insufficient documentation

## 2014-07-11 DIAGNOSIS — O133 Gestational [pregnancy-induced] hypertension without significant proteinuria, third trimester: Secondary | ICD-10-CM | POA: Diagnosis not present

## 2014-07-11 DIAGNOSIS — Z13220 Encounter for screening for lipoid disorders: Secondary | ICD-10-CM

## 2014-07-11 MED ORDER — PHENTERMINE HCL 37.5 MG PO TABS: 37.5000 mg | ORAL_TABLET | Freq: Every day | ORAL | Status: DC

## 2014-07-11 NOTE — Patient Instructions (Signed)
cKeeping You Healthy  Get These Tests 1. Blood Pressure- Have your blood pressure checked once a year by your health care provider.  Normal blood pressure is 120/80. 2. Weight- Have your body mass index (BMI) calculated to screen for obesity.  BMI is measure of body fat based on height and weight.  You can also calculate your own BMI at GravelBags.it. 3. Cholesterol- Have your cholesterol checked every 5 years starting at age 38 then yearly starting at age 69. 20. Chlamydia, HIV, and other sexually transmitted diseases- Get screened every year until age 84, then within three months of each new sexual provider. 5. Pap Smear- Every 1-3 years; discuss with your health care provider. 6. Mammogram- Every year starting at age 75  Take these medicines  Calcium with Vitamin D-Your body needs 1200 mg of Calcium each day and [phone] IU of Vitamin D daily.  Your body can only absorb 500 mg of Calcium at a time so Calcium must be taken in 2 or 3 divided doses throughout the day.  Multivitamin with folic acid- Once daily if it is possible for you to become pregnant.  Get these Immunizations  Gardasil-Series of three doses; prevents HPV related illness such as genital warts and cervical cancer.  Menactra-Single dose; prevents meningitis.  Tetanus shot- Every 10 years.  Flu shot-Every year.  Take these steps 1. Do not smoke-Your healthcare provider can help you quit.  For tips on how to quit go to www.smokefree.gov or call 1-800 QUITNOW. 2. Be physically active- Exercise 5 days a week for at least 30 minutes.  If you are not already physically active, start slow and gradually work up to 30 minutes of moderate physical activity.  Examples of moderate activity include walking briskly, dancing, swimming, bicycling, etc. 3. Breast Cancer- A self breast exam every month is important for early detection of breast cancer.  For more information and instruction on self breast exams, ask your  healthcare provider or https://www.patel.info/. 4. Eat a healthy diet- Eat a variety of healthy foods such as fruits, vegetables, whole grains, low fat milk, low fat cheeses, yogurt, lean meats, poultry and fish, beans, nuts, tofu, etc.  For more information go to www. Thenutritionsource.org 5. Drink alcohol in moderation- Limit alcohol intake to one drink or less per day. Never drink and drive. 6. Depression- Your emotional health is as important as your physical health.  If you're feeling down or losing interest in things you normally enjoy please talk to your healthcare provider about being screened for depression. 7. Dental visit- Brush and floss your teeth twice daily; visit your dentist twice a year. 8. Eye doctor- Get an eye exam at least every 2 years. 9. Helmet use- Always wear a helmet when riding a bicycle, motorcycle, rollerblading or skateboarding. 57. Safe sex- If you may be exposed to sexually transmitted infections, use a condom. 11. Seat belts- Seat belts can save your live; always wear one. 12. Smoke/Carbon Monoxide detectors- These detectors need to be installed on the appropriate level of your home. Replace batteries at least once a year. 13. Skin cancer- When out in the sun please cover up and use sunscreen 15 SPF or higher. 14. Violence- If anyone is threatening or hurting you, please tell your healthcare provider.

## 2014-07-12 NOTE — Progress Notes (Signed)
  Subjective:     Emily Irwin is a 38 y.o. female and is here for a comprehensive physical exam. The patient reports no problems but she is really wanting to lose weight. had 2 children and just not able to get to goal/healthy weight. . Pt is also a new patient.   .. Active Ambulatory Problems    Diagnosis Date Noted  . Gestational diabetes mellitus 07/11/2014  . Gestational hypertension 07/11/2014  . Obese 07/11/2014   Resolved Ambulatory Problems    Diagnosis Date Noted  . Postpartum care following cesarean delivery and BTL (5/28) 01/14/2012   Past Medical History  Diagnosis Date  . Diabetes in pregnancy   . Hypertension in pregnancy 2007  . Anemia 2007  . Headache(784.0)      History   Social History  . Marital Status: Married    Spouse Name: N/A    Number of Children: N/A  . Years of Education: N/A   Occupational History  . Not on file.   Social History Main Topics  . Smoking status: Never Smoker   . Smokeless tobacco: Never Used  . Alcohol Use: Yes  . Drug Use: No  . Sexual Activity: Yes   Other Topics Concern  . Not on file   Social History Narrative   Health Maintenance  Topic Date Due  . PAP SMEAR  02/14/1994  . INFLUENZA VACCINE  03/20/2015  . TETANUS/TDAP  01/14/2022    The following portions of the patient's history were reviewed and updated as appropriate: allergies, current medications, past family history, past medical history, past social history, past surgical history and problem list.  Review of Systems A comprehensive review of systems was negative.   Objective:    BP 134/65 mmHg  Pulse 85  Ht 5\' 6"  (1.676 m)  Wt 192 lb (87.091 kg)  BMI 31.00 kg/m2  LMP 07/03/2014 General appearance: alert, cooperative, appears stated age and moderately obese Head: Normocephalic, without obvious abnormality, atraumatic Eyes: conjunctivae/corneas clear. PERRL, EOM's intact. Fundi benign. Ears: normal TM's and external ear canals both  ears Nose: Nares normal. Septum midline. Mucosa normal. No drainage or sinus tenderness. Throat: lips, mucosa, and tongue normal; teeth and gums normal Neck: no adenopathy, no carotid bruit, no JVD, supple, symmetrical, trachea midline and thyroid not enlarged, symmetric, no tenderness/mass/nodules Back: symmetric, no curvature. ROM normal. No CVA tenderness. Lungs: clear to auscultation bilaterally Heart: regular rate and rhythm, S1, S2 normal, no murmur, click, rub or gallop Abdomen: soft, non-tender; bowel sounds normal; no masses,  no organomegaly Extremities: extremities normal, atraumatic, no cyanosis or edema Pulses: 2+ and symmetric Skin: Skin color, texture, turgor normal. No rashes or lesions Lymph nodes: Cervical, supraclavicular, and axillary nodes normal. Neurologic: Grossly normal    Assessment:    Healthy female exam.      Plan:    CPE- discussed needs pap smear and GYN exam. She will schedule. Fasting labs were given to patient. Flu shot given today. Recommended calcium 1200mg  and vitamin D 800mg  daily. Discussed weight loss and healthy diet plan.   Obese- started phentermine. Pt has tried before. Discussed side effects. Pt aware to follow up in 1 month. Discussed 1500 calorie diet to lose weight and to get at least 192min of exercise a week.  See After Visit Summary for Counseling Recommendations

## 2014-08-15 ENCOUNTER — Encounter: Payer: Self-pay | Admitting: Physician Assistant

## 2014-08-15 ENCOUNTER — Ambulatory Visit (INDEPENDENT_AMBULATORY_CARE_PROVIDER_SITE_OTHER): Payer: BC Managed Care – PPO | Admitting: Physician Assistant

## 2014-08-15 VITALS — BP 114/75 | HR 82 | Ht 66.0 in | Wt 184.0 lb

## 2014-08-15 DIAGNOSIS — E663 Overweight: Secondary | ICD-10-CM

## 2014-08-15 DIAGNOSIS — R635 Abnormal weight gain: Secondary | ICD-10-CM

## 2014-08-15 LAB — COMPLETE METABOLIC PANEL WITH GFR
ALBUMIN: 4.3 g/dL (ref 3.5–5.2)
ALK PHOS: 34 U/L — AB (ref 39–117)
ALT: 11 U/L (ref 0–35)
AST: 13 U/L (ref 0–37)
BUN: 7 mg/dL (ref 6–23)
CALCIUM: 9.2 mg/dL (ref 8.4–10.5)
CHLORIDE: 103 meq/L (ref 96–112)
CO2: 25 meq/L (ref 19–32)
Creat: 0.7 mg/dL (ref 0.50–1.10)
GFR, Est African American: >89 mL/min
GLUCOSE: 99 mg/dL (ref 70–99)
POTASSIUM: 4.2 meq/L (ref 3.5–5.3)
SODIUM: 138 meq/L (ref 135–145)
TOTAL PROTEIN: 6.5 g/dL (ref 6.0–8.3)
Total Bilirubin: 0.5 mg/dL (ref 0.2–1.2)

## 2014-08-15 LAB — LIPID PANEL
CHOLESTEROL: 161 mg/dL (ref 0–200)
HDL: 54 mg/dL (ref >39)
LDL CALC: 89 mg/dL (ref 0–99)
TRIGLYCERIDES: 91 mg/dL (ref <150)
Total CHOL/HDL Ratio: 3 Ratio
VLDL: 18 mg/dL (ref 0–40)

## 2014-08-15 MED ORDER — PHENTERMINE HCL 37.5 MG PO TABS: 37.5000 mg | ORAL_TABLET | Freq: Every day | ORAL | Status: DC

## 2014-08-15 NOTE — Progress Notes (Signed)
   Subjective:    Patient ID: Emily Irwin, female    DOB: [birth year], 38 y.o.   MRN: [id number]  HPI Pt presents to the clinic to follow up on weight with phentermine. She has lost 8lbs this is the first month.. She has noticed her appetitie is certainly decreased. She is eating around 1200 to 1500 calories a day. She is not exercising. She is having some constipation but controlled with OTC miralax and colace. Denies any palpitations, insomnia or concerning side effects.    Review of Systems  All other systems reviewed and are negative.      Objective:   Physical Exam  Constitutional: She is oriented to person, place, and time. She appears well-developed and well-nourished.  HENT:  Head: Normocephalic and atraumatic.  Cardiovascular: Normal rate, regular rhythm and normal heart sounds.   Neurological: She is alert and oriented to person, place, and time.  Psychiatric: She has a normal mood and affect. Her behavior is normal.          Assessment & Plan:  Overweight/abnormal weight gain- refilled phentermine. Discussed if side effects are too much cut in half. Encouraged pt to start some type of exercise plan. Continue 1200-1500 calorie diet. Follow up in 1 month next visit. Vitals look great today.

## 2014-08-16 LAB — HEMOGLOBIN A1C
HEMOGLOBIN A1C: 5.5 % (ref <5.7)
MEAN PLASMA GLUCOSE: 111 mg/dL (ref <117)

## 2014-09-12 ENCOUNTER — Encounter: Payer: BC Managed Care – PPO | Admitting: Physician Assistant

## 2014-09-23 ENCOUNTER — Ambulatory Visit (INDEPENDENT_AMBULATORY_CARE_PROVIDER_SITE_OTHER): Payer: BLUE CROSS/BLUE SHIELD | Admitting: Physician Assistant

## 2014-09-23 VITALS — BP 117/69 | HR 83 | Ht 66.0 in | Wt 176.0 lb

## 2014-09-23 DIAGNOSIS — R635 Abnormal weight gain: Secondary | ICD-10-CM

## 2014-09-23 MED ORDER — PHENTERMINE HCL 37.5 MG PO TABS: 37.5000 mg | ORAL_TABLET | Freq: Every day | ORAL | Status: DC

## 2014-09-23 NOTE — Progress Notes (Signed)
Patient doing well on appetite suppressant. Here for nurse visit, weight, BP, HR check. Denies problems with insomnia, heart palpitations or tremors. Satisfied with weight loss thus far and is working on [employer] and regular exercise.  Emily Irwin, [name]

## 2014-10-21 ENCOUNTER — Ambulatory Visit: Payer: BLUE CROSS/BLUE SHIELD

## 2014-11-04 ENCOUNTER — Ambulatory Visit: Payer: BLUE CROSS/BLUE SHIELD

## 2014-11-10 ENCOUNTER — Ambulatory Visit (INDEPENDENT_AMBULATORY_CARE_PROVIDER_SITE_OTHER): Payer: BLUE CROSS/BLUE SHIELD | Admitting: Family Medicine

## 2014-11-10 VITALS — BP 125/85 | HR 100 | Ht 66.0 in | Wt 173.0 lb

## 2014-11-10 DIAGNOSIS — R635 Abnormal weight gain: Secondary | ICD-10-CM

## 2014-11-10 MED ORDER — PHENTERMINE HCL 37.5 MG PO TABS: 37.5000 mg | ORAL_TABLET | Freq: Every day | ORAL | Status: DC

## 2014-11-10 NOTE — Progress Notes (Signed)
   Subjective:    Patient ID: Emily Irwin, female    DOB: 02/20/1976, 39 y.o.   MRN: [id number]  HPI Patient is here for blood pressure and weight check. Denies any trouble sleeping, palpitations, or any other medication problems. Patient states she has made some dietary changes but went on two vacations ([place] and [name], [place]) and did not stick to her diet during those times.   Review of Systems     Objective:   Physical Exam        Assessment & Plan:  Patient has some weight loss. A refill for Phentermine will be sent to patient preferred pharmacy (she changed which location she would like to use). Patient advised to schedule a four week nurse visit and keep her upcoming appointment with her PCP. Verbalized understanding, no further questions.  Abnormal weight gain-down 3 pounds. Phentermine refilled.  Beatrice Lecher, MD

## 2014-11-15 ENCOUNTER — Ambulatory Visit: Payer: BLUE CROSS/BLUE SHIELD

## 2014-12-09 ENCOUNTER — Ambulatory Visit: Payer: BLUE CROSS/BLUE SHIELD

## 2014-12-12 ENCOUNTER — Ambulatory Visit (INDEPENDENT_AMBULATORY_CARE_PROVIDER_SITE_OTHER): Payer: BLUE CROSS/BLUE SHIELD | Admitting: Physician Assistant

## 2014-12-12 VITALS — BP 125/82 | HR 79 | Wt 173.0 lb

## 2014-12-12 DIAGNOSIS — R635 Abnormal weight gain: Secondary | ICD-10-CM

## 2014-12-12 MED ORDER — PHENTERMINE HCL 37.5 MG PO TABS: 37.5000 mg | ORAL_TABLET | Freq: Every day | ORAL | Status: DC

## 2014-12-12 NOTE — Progress Notes (Signed)
Patient came in today for BP & weight check.  Patient concern is that she doesn't she seem to be losing weight &   that she may have hit a plato. She stated that she is no longer doing the 6 small meals but now just 3 meals a day.  Blood pressure  Is doing well.    Abnormal weight gain- refilled for one month. If does not lose any weight needs to discuss nutrition referral and evaluation. Iran Planas PA-C

## 2014-12-15 ENCOUNTER — Telehealth: Payer: Self-pay | Admitting: Physician Assistant

## 2014-12-15 NOTE — Telephone Encounter (Signed)
Received fax for pa on phentermine sent through cover my meds waiting on auth. - CF

## 2014-12-19 NOTE — Telephone Encounter (Signed)
Medication was denied due to not meeting definition of medical necessity - CF

## 2015-01-12 ENCOUNTER — Ambulatory Visit (INDEPENDENT_AMBULATORY_CARE_PROVIDER_SITE_OTHER): Payer: BLUE CROSS/BLUE SHIELD | Admitting: Family Medicine

## 2015-01-12 VITALS — BP 136/89 | HR 65 | Wt 171.0 lb

## 2015-01-12 DIAGNOSIS — R635 Abnormal weight gain: Secondary | ICD-10-CM

## 2015-01-12 DIAGNOSIS — Z6827 Body mass index (BMI) 27.0-27.9, adult: Secondary | ICD-10-CM

## 2015-01-12 MED ORDER — PHENTERMINE HCL 37.5 MG PO TABS: 37.5000 mg | ORAL_TABLET | Freq: Every day | ORAL | Status: DC

## 2015-01-12 NOTE — Progress Notes (Signed)
   Subjective:    Patient ID: Emily Irwin, female    DOB: 1975/09/06, 39 y.o.   MRN: [id number]  HPI Patient is here for blood pressure and weight check. Denies any trouble sleeping, palpitations, or any other medication problems. Patient states she has tried to work on dietary changes but has forgotten to take the Rx a couple days over the last month.    Review of Systems     Objective:   Physical Exam        Assessment & Plan:  Patient has lost some weight. A refill for Phentermine will be sent to patient preferred pharmacy. Patient advised to schedule a four week nurse visit and keep her upcoming appointment with her PCP. Verbalized understanding, no further questions.

## 2015-01-12 NOTE — Progress Notes (Signed)
   Subjective:    Patient ID: Emily Irwin, female    DOB: Apr 12, 1976, 39 y.o.   MRN: [id number]  HPI Abnormal weight gain -  she's lost 2 pounds and is doing well. Blood pressure is a little borderline today so we need to keep a close eye on that. Follow-up in 4 weeks. Medication refill. Beatrice Lecher, MD    Review of Systems     Objective:   Physical Exam        Assessment & Plan:

## 2015-05-01 NOTE — Progress Notes (Signed)
This encounter was created in error - please disregard.

## 2015-09-08 ENCOUNTER — Encounter: Payer: Self-pay | Admitting: Physician Assistant

## 2015-09-08 ENCOUNTER — Ambulatory Visit (INDEPENDENT_AMBULATORY_CARE_PROVIDER_SITE_OTHER): Payer: BLUE CROSS/BLUE SHIELD | Admitting: Physician Assistant

## 2015-09-08 VITALS — BP 116/42 | HR 48 | Ht 66.0 in | Wt 183.0 lb

## 2015-09-08 DIAGNOSIS — Z13220 Encounter for screening for lipoid disorders: Secondary | ICD-10-CM | POA: Diagnosis not present

## 2015-09-08 DIAGNOSIS — Z0000 Encounter for general adult medical examination without abnormal findings: Secondary | ICD-10-CM | POA: Diagnosis not present

## 2015-09-08 DIAGNOSIS — E663 Overweight: Secondary | ICD-10-CM

## 2015-09-08 DIAGNOSIS — O24429 Gestational diabetes mellitus in childbirth, unspecified control: Secondary | ICD-10-CM

## 2015-09-08 DIAGNOSIS — Z131 Encounter for screening for diabetes mellitus: Secondary | ICD-10-CM

## 2015-09-08 DIAGNOSIS — G43001 Migraine without aura, not intractable, with status migrainosus: Secondary | ICD-10-CM

## 2015-09-08 LAB — LIPID PANEL
CHOLESTEROL: 177 mg/dL (ref 125–200)
HDL: 64 mg/dL (ref >=46)
LDL Cholesterol: 94 mg/dL (ref <130)
TRIGLYCERIDES: 97 mg/dL (ref <150)
Total CHOL/HDL Ratio: 2.8 Ratio (ref <=5.0)
VLDL: 19 mg/dL (ref <30)

## 2015-09-08 LAB — COMPLETE METABOLIC PANEL WITH GFR
ALBUMIN: 4.1 g/dL (ref 3.6–5.1)
ALT: 14 U/L (ref 6–29)
AST: 15 U/L (ref 10–30)
Alkaline Phosphatase: 31 U/L — ABNORMAL LOW (ref 33–115)
BILIRUBIN TOTAL: 0.6 mg/dL (ref 0.2–1.2)
BUN: 8 mg/dL (ref 7–25)
CALCIUM: 8.8 mg/dL (ref 8.6–10.2)
CO2: 24 mmol/L (ref 20–31)
CREATININE: 0.64 mg/dL (ref 0.50–1.10)
Chloride: 102 mmol/L (ref 98–110)
GFR, Est Non African American: >89 mL/min (ref >=60)
Glucose, Bld: 84 mg/dL (ref 65–99)
Potassium: 3.9 mmol/L (ref 3.5–5.3)
Sodium: 137 mmol/L (ref 135–146)
TOTAL PROTEIN: 6.3 g/dL (ref 6.1–8.1)

## 2015-09-08 MED ORDER — TOPIRAMATE 50 MG PO TABS: ORAL_TABLET | ORAL | Status: DC

## 2015-09-08 MED ORDER — PHENTERMINE HCL 37.5 MG PO TABS: 37.5000 mg | ORAL_TABLET | Freq: Every day | ORAL | Status: DC

## 2015-09-08 NOTE — Patient Instructions (Addendum)
Add vitamin d 800units.   Keeping You Healthy  Get These Tests 1. Blood Pressure- Have your blood pressure checked once a year by your health care provider.  Normal blood pressure is 120/80. 2. Weight- Have your body mass index (BMI) calculated to screen for obesity.  BMI is measure of body fat based on height and weight.  You can also calculate your own BMI at GravelBags.it. 3. Cholesterol- Have your cholesterol checked every 5 years starting at age 40 then yearly starting at age 68. 65. Chlamydia, HIV, and other sexually transmitted diseases- Get screened every year until age 102, then within three months of each new sexual provider. 5. Pap Test - Every 1-5 years; discuss with your health care provider. 6. Mammogram- Every 1-2 years starting at age 65--50  Take these medicines  Calcium with Vitamin D-Your body needs 1200 mg of Calcium each day and [phone] IU of Vitamin D daily.  Your body can only absorb 500 mg of Calcium at a time so Calcium must be taken in 2 or 3 divided doses throughout the day.  Multivitamin with folic acid- Once daily if it is possible for you to become pregnant.  Get these Immunizations  Gardasil-Series of three doses; prevents HPV related illness such as genital warts and cervical cancer.  Menactra-Single dose; prevents meningitis.  Tetanus shot- Every 10 years.  Flu shot-Every year.  Take these steps 1. Do not smoke-Your healthcare provider can help you quit.  For tips on how to quit go to www.smokefree.gov or call 1-800 QUITNOW. 2. Be physically active- Exercise 5 days a week for at least 30 minutes.  If you are not already physically active, start slow and gradually work up to 30 minutes of moderate physical activity.  Examples of moderate activity include walking briskly, dancing, swimming, bicycling, etc. 3. Breast Cancer- A self breast exam every month is important for early detection of breast cancer.  For more information and instruction on  self breast exams, ask your healthcare provider or https://www.patel.info/. 4. Eat a healthy diet- Eat a variety of healthy foods such as fruits, vegetables, whole grains, low fat milk, low fat cheeses, yogurt, lean meats, poultry and fish, beans, nuts, tofu, etc.  For more information go to www. Thenutritionsource.org 5. Drink alcohol in moderation- Limit alcohol intake to one drink or less per day. Never drink and drive. 6. Depression- Your emotional health is as important as your physical health.  If you're feeling down or losing interest in things you normally enjoy please talk to your healthcare provider about being screened for depression. 7. Dental visit- Brush and floss your teeth twice daily; visit your dentist twice a year. 8. Eye doctor- Get an eye exam at least every 2 years. 9. Helmet use- Always wear a helmet when riding a bicycle, motorcycle, rollerblading or skateboarding. 104. Safe sex- If you may be exposed to sexually transmitted infections, use a condom. 11. Seat belts- Seat belts can save your live; always wear one. 12. Smoke/Carbon Monoxide detectors- These detectors need to be installed on the appropriate level of your home. Replace batteries at least once a year. 13. Skin cancer- When out in the sun please cover up and use sunscreen 15 SPF or higher. 14. Violence- If anyone is threatening or hurting you, please tell your healthcare provider.

## 2015-09-09 LAB — VITAMIN D 25 HYDROXY (VIT D DEFICIENCY, FRACTURES): VIT D 25 HYDROXY: 18 ng/mL — AB (ref 30–100)

## 2015-09-09 LAB — HEMOGLOBIN A1C
HEMOGLOBIN A1C: 5.4 % (ref <5.7)
MEAN PLASMA GLUCOSE: 108 mg/dL (ref <117)

## 2015-09-11 ENCOUNTER — Encounter: Payer: Self-pay | Admitting: Physician Assistant

## 2015-09-11 DIAGNOSIS — E559 Vitamin D deficiency, unspecified: Secondary | ICD-10-CM | POA: Insufficient documentation

## 2015-09-11 DIAGNOSIS — E663 Overweight: Secondary | ICD-10-CM | POA: Insufficient documentation

## 2015-09-11 NOTE — Progress Notes (Signed)
  Subjective:     Emily Irwin is a 40 y.o. female and is here for a comprehensive physical exam. The patient reports problems - she has gained weight over the last year. she would like to try medication. she was given phentermine once but lost rx and never started. she knows she needs to lose weight. she is not currently exercising or dieting. she does have migraines. she usually has at least one a week. no aura. ibuprofen usually relieves migraine along with rest. .  Social History   Social History  . Marital Status: Married    Spouse Name: N/A  . Number of Children: N/A  . Years of Education: N/A   Occupational History  . Not on file.   Social History Main Topics  . Smoking status: Never Smoker   . Smokeless tobacco: Never Used  . Alcohol Use: Yes  . Drug Use: No  . Sexual Activity: Yes   Other Topics Concern  . Not on file   Social History Narrative   Health Maintenance  Topic Date Due  . URINE MICROALBUMIN  02/14/1986  . PAP SMEAR  02/14/1997  . INFLUENZA VACCINE  03/19/2016  . TETANUS/TDAP  01/14/2022  . HIV Screening  Completed      Review of Systems Pertinent items noted in HPI and remainder of comprehensive ROS otherwise negative.   Objective:    BP 116/42 mmHg  Pulse 48  Ht 5\' 6"  (1.676 m)  Wt 183 lb (83.008 kg)  BMI 29.55 kg/m2 General appearance: alert, cooperative, appears stated age and mildly obese Head: Normocephalic, without obvious abnormality, atraumatic Eyes: conjunctivae/corneas clear. PERRL, EOM's intact. Fundi benign. Ears: normal TM's and external ear canals both ears Nose: Nares normal. Septum midline. Mucosa normal. No drainage or sinus tenderness. Throat: lips, mucosa, and tongue normal; teeth and gums normal Neck: no adenopathy, no carotid bruit, no JVD, supple, symmetrical, trachea midline and thyroid not enlarged, symmetric, no tenderness/mass/nodules Back: symmetric, no curvature. ROM normal. No CVA tenderness. Lungs:  clear to auscultation bilaterally Heart: regular rate and rhythm, S1, S2 normal, no murmur, click, rub or gallop Abdomen: soft, non-tender; bowel sounds normal; no masses,  no organomegaly Extremities: extremities normal, atraumatic, no cyanosis or edema Pulses: 2+ and symmetric Skin: Skin color, texture, turgor normal. No rashes or lesions Lymph nodes: Cervical, supraclavicular, and axillary nodes normal. Neurologic: Grossly normal    Assessment:    Healthy female exam.      Plan:    CPE- lipid, cmp, vitamin D ordered. See weight discussion below. Discussed vitamin D 800 and calcium 1500mg  daily.  Pt see's GYN for pap and mammogram discussion.   Gestational DM- will screen for DM with A!C.   Migraine w/out aura- continue to use ibuprofen for relief. Started topamax daily for HA prevention.   Overweight- topamax should also help but will start phentermine. SE's discussed. Follow up in 1 month. Discussed regular exercise and healthy diet.  See After Visit Summary for Counseling Recommendations

## 2015-10-06 ENCOUNTER — Ambulatory Visit: Payer: BLUE CROSS/BLUE SHIELD

## 2015-10-10 ENCOUNTER — Ambulatory Visit (INDEPENDENT_AMBULATORY_CARE_PROVIDER_SITE_OTHER): Payer: BLUE CROSS/BLUE SHIELD | Admitting: Physician Assistant

## 2015-10-10 VITALS — BP 119/60 | HR 82 | Wt 178.0 lb

## 2015-10-10 DIAGNOSIS — E669 Obesity, unspecified: Secondary | ICD-10-CM | POA: Diagnosis not present

## 2015-10-10 DIAGNOSIS — R635 Abnormal weight gain: Secondary | ICD-10-CM

## 2015-10-10 MED ORDER — PHENTERMINE HCL 37.5 MG PO TABS: 37.5000 mg | ORAL_TABLET | Freq: Every day | ORAL | Status: DC

## 2015-10-10 NOTE — Progress Notes (Signed)
Patient is here for blood pressure and weight check. Denies any trouble sleeping, palpitations, or any other medication problems. Pt does state she was started on Topamax for headaches and when she increased the dose on this Rx it caused her to be very drowsy and have sinus drainage. Pt reports she has been taking half a tab daily (25mg ) and tolerating that well, but she may wean off it completely. Request no refill be sent. Patient has lost weight. A refill for Phentermine will be sent to patient preferred pharmacy. Patient advised to schedule a four week nurse visit and keep her upcoming appointment with her PCP. Verbalized understanding, no further questions.

## 2015-11-07 ENCOUNTER — Ambulatory Visit: Payer: BLUE CROSS/BLUE SHIELD

## 2015-11-20 ENCOUNTER — Ambulatory Visit: Payer: BLUE CROSS/BLUE SHIELD

## 2015-11-23 ENCOUNTER — Ambulatory Visit (INDEPENDENT_AMBULATORY_CARE_PROVIDER_SITE_OTHER): Payer: BLUE CROSS/BLUE SHIELD | Admitting: Family Medicine

## 2015-11-23 VITALS — BP 125/83 | HR 93 | Wt 178.0 lb

## 2015-11-23 DIAGNOSIS — R635 Abnormal weight gain: Secondary | ICD-10-CM

## 2015-11-23 MED ORDER — TOPIRAMATE 50 MG PO TABS: ORAL_TABLET | ORAL | Status: DC

## 2015-11-23 MED ORDER — PHENTERMINE HCL 37.5 MG PO TABS: 37.5000 mg | ORAL_TABLET | Freq: Every day | ORAL | Status: DC

## 2015-11-23 NOTE — Progress Notes (Signed)
Patient is here for blood pressure and weight check. Denies any trouble sleeping, palpitations, or any other medication problems. Patient has not lost weight. A refill for Phentermine will be sent to Provider for review. Patient advised if Rx is refilled to schedule a four week nurse visit and keep her upcoming appointment with her PCP. Verbalized understanding, no further questions.

## 2015-11-23 NOTE — Progress Notes (Signed)
She really hasn't lost any weight, but didn't gain. I will refill for one more month adn then she can f/u with PCP

## 2016-04-27 DIAGNOSIS — Z23 Encounter for immunization: Secondary | ICD-10-CM | POA: Diagnosis not present

## 2016-04-27 DIAGNOSIS — N390 Urinary tract infection, site not specified: Secondary | ICD-10-CM | POA: Diagnosis not present

## 2016-09-06 ENCOUNTER — Ambulatory Visit: Payer: BLUE CROSS/BLUE SHIELD | Admitting: Physician Assistant

## 2016-09-09 ENCOUNTER — Encounter: Payer: Self-pay | Admitting: Physician Assistant

## 2016-09-09 ENCOUNTER — Ambulatory Visit (INDEPENDENT_AMBULATORY_CARE_PROVIDER_SITE_OTHER): Payer: BLUE CROSS/BLUE SHIELD | Admitting: Physician Assistant

## 2016-09-09 VITALS — BP 132/83 | HR 84 | Ht 66.0 in | Wt 189.0 lb

## 2016-09-09 DIAGNOSIS — E669 Obesity, unspecified: Secondary | ICD-10-CM

## 2016-09-09 DIAGNOSIS — Z6830 Body mass index (BMI) 30.0-30.9, adult: Secondary | ICD-10-CM

## 2016-09-09 DIAGNOSIS — D1720 Benign lipomatous neoplasm of skin and subcutaneous tissue of unspecified limb: Secondary | ICD-10-CM | POA: Diagnosis not present

## 2016-09-09 DIAGNOSIS — E66811 Obesity, class 1: Secondary | ICD-10-CM

## 2016-09-09 DIAGNOSIS — M545 Low back pain, unspecified: Secondary | ICD-10-CM | POA: Insufficient documentation

## 2016-09-09 DIAGNOSIS — G43001 Migraine without aura, not intractable, with status migrainosus: Secondary | ICD-10-CM | POA: Diagnosis not present

## 2016-09-09 DIAGNOSIS — G8929 Other chronic pain: Secondary | ICD-10-CM

## 2016-09-09 MED ORDER — LORCASERIN HCL 10 MG PO TABS: 1.0000 | ORAL_TABLET | Freq: Two times a day (BID) | ORAL | 2 refills | Status: DC

## 2016-09-09 MED ORDER — RIZATRIPTAN BENZOATE 10 MG PO TABS: 10.0000 mg | ORAL_TABLET | ORAL | 0 refills | Status: DC | PRN

## 2016-09-09 MED ORDER — LEVONORGEST-ETH ESTRAD 91-DAY 0.15-0.03 &0.01 MG PO TABS: 1.0000 | ORAL_TABLET | Freq: Every day | ORAL | 3 refills | Status: DC

## 2016-09-09 MED ORDER — ONDANSETRON HCL 4 MG PO TABS: 4.0000 mg | ORAL_TABLET | Freq: Three times a day (TID) | ORAL | 2 refills | Status: DC | PRN

## 2016-09-09 NOTE — Progress Notes (Addendum)
Subjective:    Patient ID: Emily Irwin, female    DOB: [birth year], 41 y.o.   MRN: [id number]  HPI Patient is a 41yo female who presents to the clinic with several concerns.   Patient reports she noticed a lump on her left forearm 1 year ago.  Patient states it has not grown and usually is not painful.  She states that occasionally it becomes painful and begins to itch.  Patient denies any overlying skin changes or a rash.  Patient denies any other locations of concern.    Patient states she has chronic hormonal headaches.  She frequently takes ibuprofen and Goody's powder, which have begun to hurt her stomach.  Patient would like to discuss other options.  She reports she tried topamax for a brief period, but discontinued it due to increased headaches.  Patient reports her headaches improved when she was on IUD.  Patient states she also tried a birth control patch, but during the off-week her headaches would worsen.      Patient would like a refill of zofran for her nausea that occurs with her headaches.    Patient reports she tried phentermine twice in the past.  She would like to consider a long-term weight loss medication option.    Patient has also had chronic lower back pain for years.  She states she uses advil and ice to decrease her back pain flare.  She is interested in seeing a chiropractor.  She states she has seen one in the past, which helped.  She states her back pain improves with exercise and stretching.  Patient denies any numbness or tingling down either leg.  Patient denies midline back pain.     Review of Systems  Musculoskeletal: Positive for back pain.  Neurological: Positive for headaches (during menstrual period ).       Objective:   Physical Exam  Constitutional: She is oriented to person, place, and time. She appears well-developed and well-nourished.  HENT:  Head: Normocephalic and atraumatic.  Cardiovascular: Normal rate, regular rhythm and normal  heart sounds.   Pulmonary/Chest: Effort normal and breath sounds normal.  Musculoskeletal: Normal range of motion. She exhibits no tenderness (No midline tenderness ).  Neurological: She is alert and oriented to person, place, and time.  Skin:  1cm by 1cm mobile non tender nodule of left forearm. No overlying skin changes on left forearm   Psychiatric: She has a normal mood and affect. Her behavior is normal.  Nursing note and vitals reviewed.         Assessment & Plan:  Marland KitchenMarland KitchenDiagnoses and all orders for this visit:  Lipoma of upper extremity, unspecified laterality  Migraine without aura and with status migrainosus, not intractable -     ondansetron (ZOFRAN) 4 MG tablet; Take 1 tablet (4 mg total) by mouth every 8 (eight) hours as needed for nausea or vomiting. -     rizatriptan (MAXALT) 10 MG tablet; Take 1 tablet (10 mg total) by mouth as needed for migraine. May repeat in 2 hours if needed -     Levonorgestrel-Ethinyl Estradiol (AMETHIA,CAMRESE) 0.15-0.03 &0.01 MG tablet; Take 1 tablet by mouth daily.  Chronic midline low back pain without sciatica  Class 1 obesity without serious comorbidity with body mass index (BMI) of 30.0 to 30.9 in adult, unspecified obesity type -     Lorcaserin HCl (BELVIQ) 10 MG TABS; Take 1 tablet by mouth 2 (two) times daily.   Discussed with patient likely lipoma.Patient informed  lipomas are benign.  Informed patient that if it grows or becomes more painful an ultrasound can be performed or it can be excised.    Patient can make an appointment with a chiropractor for her chronic low back pain.  Patient given information about strengthening exercises.  Patient instructed to continue to alternate heat and ice during flare-ups.    Patient given a prescription for Belviq 10mg  for long-term weight loss management.  Side effects explained.    Patient's headaches seem to be due to her hormones during her menstrual cycle.  Patient prescribed Amethia-camrese  daily to help with preventing her headaches.  Patient also given maxalt 10mg  for severe headaches.  Zofran 4mg  was refilled for nausea related to headaches.    Follow-up in 2 months.   Spent 30 minutes face to face with patient, greater than 50 percent of encounter,  counseling about above treatment plan.

## 2016-09-09 NOTE — Patient Instructions (Addendum)
Low Back Sprain Rehab  Ask your health care provider which exercises are safe for you. Do exercises exactly as told by your health care provider and adjust them as directed. It is normal to feel mild stretching, pulling, tightness, or discomfort as you do these exercises, but you should stop right away if you feel sudden pain or your pain gets worse. Do not begin these exercises until told by your health care provider.  Stretching and range of motion exercises  These exercises warm up your muscles and joints and improve the movement and flexibility of your back. These exercises also help to relieve pain, numbness, and tingling.  Exercise A: Lumbar rotation     1. Lie on your back on a firm surface and bend your knees.  2. Straighten your arms out to your sides so each arm forms an "L" shape with a side of your body (a 90 degree angle).  3. Slowly move both of your knees to one side of your body until you feel a stretch in your lower back. Try not to let your shoulders move off of the floor.  4. Hold for __________ seconds.  5. Tense your abdominal muscles and slowly move your knees back to the starting position.  6. Repeat this exercise on the other side of your body.  Repeat __________ times. Complete this exercise __________ times a day.  Exercise B: Prone extension on elbows     1. Lie on your abdomen on a firm surface.  2. Prop yourself up on your elbows.  3. Use your arms to help lift your chest up until you feel a gentle stretch in your abdomen and your lower back.  ? This will place some of your body weight on your elbows. If this is uncomfortable, try stacking pillows under your chest.  ? Your hips should stay down, against the surface that you are lying on. Keep your hip and back muscles relaxed.  4. Hold for __________ seconds.  5. Slowly relax your upper body and return to the starting position.  Repeat __________ times. Complete this exercise __________ times a day.  Strengthening exercises  These  exercises build strength and endurance in your back. Endurance is the ability to use your muscles for a long time, even after they get tired.  Exercise C: Pelvic tilt   1. Lie on your back on a firm surface. Bend your knees and keep your feet flat.  2. Tense your abdominal muscles. Tip your pelvis up toward the ceiling and flatten your lower back into the floor.  ? To help with this exercise, you may place a small towel under your lower back and try to push your back into the towel.  3. Hold for __________ seconds.  4. Let your muscles relax completely before you repeat this exercise.  Repeat __________ times. Complete this exercise __________ times a day.  Exercise D: Alternating arm and leg raises     1. Get on your hands and knees on a firm surface. If you are on a hard floor, you may want to use padding to cushion your knees, such as an exercise mat.  2. Line up your arms and legs. Your hands should be below your shoulders, and your knees should be below your hips.  3. Lift your left leg behind you. At the same time, raise your right arm and straighten it in front of you.  ? Do not lift your leg higher than your hip.  ? Do   not lift your arm higher than your shoulder.  ? Keep your abdominal and back muscles tight.  ? Keep your hips facing the ground.  ? Do not arch your back.  ? Keep your balance carefully, and do not hold your breath.  4. Hold for __________ seconds.  5. Slowly return to the starting position and repeat with your right leg and your left arm.  Repeat __________ times. Complete this exercise __________ times a day.  Exercise E: Abdominal set with straight leg raise     1. Lie on your back on a firm surface.  2. Bend one of your knees and keep your other leg straight.  3. Tense your abdominal muscles and lift your straight leg up, 4-6 inches (10-15 cm) off the ground.  4. Keep your abdominal muscles tight and hold for __________ seconds.  ? Do not hold your breath.  ? Do not arch your back. Keep it  flat against the ground.  5. Keep your abdominal muscles tense as you slowly lower your leg back to the starting position.  6. Repeat with your other leg.  Repeat __________ times. Complete this exercise __________ times a day.  Posture and body mechanics     Body mechanics refers to the movements and positions of your body while you do your daily activities. Posture is part of body mechanics. Good posture and healthy body mechanics can help to relieve stress in your body's tissues and joints. Good posture means that your spine is in its natural S-curve position (your spine is neutral), your shoulders are pulled back slightly, and your head is not tipped forward. The following are general guidelines for applying improved posture and body mechanics to your everyday activities.  Standing     · When standing, keep your spine neutral and your feet about hip-width apart. Keep a slight bend in your knees. Your ears, shoulders, and hips should line up.  · When you do a task in which you stand in one place for a long time, place one foot up on a stable object that is 2-4 inches (5-10 cm) high, such as a footstool. This helps keep your spine neutral.  Sitting     · When sitting, keep your spine neutral and keep your feet flat on the floor. Use a footrest, if necessary, and keep your thighs parallel to the floor. Avoid rounding your shoulders, and avoid tilting your head forward.  · When working at a desk or a computer, keep your desk at a height where your hands are slightly lower than your elbows. Slide your chair under your desk so you are close enough to maintain good posture.  · When working at a computer, place your monitor at a height where you are looking straight ahead and you do not have to tilt your head forward or downward to look at the screen.  Resting     · When lying down and resting, avoid positions that are most painful for you.  · If you have pain with activities such as sitting, bending, stooping, or  squatting (flexion-based activities), lie in a position in which your body does not bend very much. For example, avoid curling up on your side with your arms and knees near your chest (fetal position).  · If you have pain with activities such as standing for a long time or reaching with your arms (extension-based activities), lie with your spine in a neutral position and bend your knees slightly. Try the following   is not intended to replace advice given to you by your health care provider. Make sure you discuss any questions you have with your health care provider. Document Released: 08/05/2005 Document Revised: 04/11/2016 Document Reviewed: 05/17/2015 Elsevier Interactive Patient Education  2017 [employer] Lipoma Introduction A lipoma is a noncancerous (benign) tumor that is made up of fat cells. This is a very common type of soft-tissue growth. Lipomas are usually found under the skin (subcutaneous). They may occur in any tissue of the body that contains fat. Common areas for lipomas to appear include the back, shoulders, buttocks, and thighs. Lipomas grow slowly, and they are usually painless. Most lipomas do not cause problems and do not require treatment. What are the causes? The cause of this condition is not known. What increases the risk? This condition is more likely to develop in:  People who are 67-28 years old.  People who have a family history of lipomas. What are the signs or symptoms? A lipoma usually appears as a small, round bump under  the skin. It may feel soft or rubbery, but the firmness can vary. Most lipomas are not painful. However, a lipoma may become painful if it is located in an area where it pushes on nerves. How is this diagnosed? A lipoma can usually be diagnosed with a physical exam. You may also have tests to confirm the diagnosis and to rule out other conditions. Tests may include:  Imaging tests, such as a CT scan or MRI.  Removal of a tissue sample to be looked at under a microscope (biopsy). How is this treated? Treatment is not needed for small lipomas that are not causing problems. If a lipoma continues to get bigger or it causes problems, removal is often the best option. Lipomas can also be removed to improve appearance. Removal of a lipoma is usually done with a surgery in which the fatty cells and the surrounding capsule are removed. Most often, a medicine that numbs the area (local anesthetic) is used for this procedure. Follow these instructions at home:  Keep all follow-up visits as directed by your health care provider. This is important. Contact a health care provider if:  Your lipoma becomes larger or hard.  Your lipoma becomes painful, red, or increasingly swollen. These could be signs of infection or a more serious condition. This information is not intended to replace advice given to you by your health care provider. Make sure you discuss any questions you have with your health care provider. Document Released: 07/26/2002 Document Revised: 01/11/2016 Document Reviewed: 08/01/2014  2017 Elsevier

## 2016-10-15 DIAGNOSIS — Z1151 Encounter for screening for human papillomavirus (HPV): Secondary | ICD-10-CM | POA: Diagnosis not present

## 2016-10-15 DIAGNOSIS — Z6830 Body mass index (BMI) 30.0-30.9, adult: Secondary | ICD-10-CM | POA: Diagnosis not present

## 2016-10-15 DIAGNOSIS — Z1231 Encounter for screening mammogram for malignant neoplasm of breast: Secondary | ICD-10-CM | POA: Diagnosis not present

## 2016-10-15 DIAGNOSIS — Z01419 Encounter for gynecological examination (general) (routine) without abnormal findings: Secondary | ICD-10-CM | POA: Diagnosis not present

## 2016-10-15 LAB — HM PAP SMEAR: HM PAP: NEGATIVE

## 2016-11-08 ENCOUNTER — Ambulatory Visit: Payer: BLUE CROSS/BLUE SHIELD | Admitting: Physician Assistant

## 2017-12-26 DIAGNOSIS — R300 Dysuria: Secondary | ICD-10-CM | POA: Diagnosis not present

## 2018-07-06 ENCOUNTER — Encounter: Payer: Self-pay | Admitting: Physician Assistant

## 2018-07-06 ENCOUNTER — Ambulatory Visit (INDEPENDENT_AMBULATORY_CARE_PROVIDER_SITE_OTHER): Payer: BLUE CROSS/BLUE SHIELD | Admitting: Physician Assistant

## 2018-07-06 VITALS — BP 122/55 | HR 71 | Ht 65.5 in | Wt 185.0 lb

## 2018-07-06 DIAGNOSIS — Z13220 Encounter for screening for lipoid disorders: Secondary | ICD-10-CM | POA: Diagnosis not present

## 2018-07-06 DIAGNOSIS — Z131 Encounter for screening for diabetes mellitus: Secondary | ICD-10-CM | POA: Diagnosis not present

## 2018-07-06 DIAGNOSIS — K219 Gastro-esophageal reflux disease without esophagitis: Secondary | ICD-10-CM | POA: Insufficient documentation

## 2018-07-06 DIAGNOSIS — Z23 Encounter for immunization: Secondary | ICD-10-CM | POA: Diagnosis not present

## 2018-07-06 DIAGNOSIS — Z0000 Encounter for general adult medical examination without abnormal findings: Secondary | ICD-10-CM | POA: Diagnosis not present

## 2018-07-06 DIAGNOSIS — G43001 Migraine without aura, not intractable, with status migrainosus: Secondary | ICD-10-CM

## 2018-07-06 DIAGNOSIS — E559 Vitamin D deficiency, unspecified: Secondary | ICD-10-CM

## 2018-07-06 LAB — POCT GLYCOSYLATED HEMOGLOBIN (HGB A1C): Hemoglobin A1C: 5 % (ref 4.0–5.6)

## 2018-07-06 MED ORDER — ONDANSETRON HCL 4 MG PO TABS: 4.0000 mg | ORAL_TABLET | Freq: Three times a day (TID) | ORAL | 3 refills | Status: DC | PRN

## 2018-07-06 NOTE — Progress Notes (Signed)
Subjective:     Emily Irwin is a 42 y.o. female and is here for a comprehensive physical exam. The patient reports problems - see below. .  Patient is having some problems with heartburn.  It seems to be very linked to certain foods.  Seems to be very triggered by ketchup, red meat, [place] fries, spicy foods.  She denies any nausea or abdominal pain.  Usually she will feel a sharp acid burning feeling in her stomach that would go away with water and time and food avoidance.  She has been noticing this for the last 6 months.  Migraines-patient is having less than 1 month.  She did not like the way Maxalt was making her feel.  She gets fairly good relief with Tylenol and ibuprofen.   Her periods have started to become much lighter down to 3 days a week. She has notice some hot flashes a few times. She has GYN.   Social History   Socioeconomic History  . Marital status: Married    Spouse name: Not on file  . Number of children: Not on file  . Years of education: Not on file  . Highest education level: Not on file  Occupational History  . Not on file  Social Needs  . Financial resource strain: Not on file  . Food insecurity:    Worry: Not on file    Inability: Not on file  . Transportation needs:    Medical: Not on file    Non-medical: Not on file  Tobacco Use  . Smoking status: Never Smoker  . Smokeless tobacco: Never Used  Substance and Sexual Activity  . Alcohol use: Yes  . Drug use: No  . Sexual activity: Yes  Lifestyle  . Physical activity:    Days per week: Not on file    Minutes per session: Not on file  . Stress: Not on file  Relationships  . Social connections:    Talks on phone: Not on file    Gets together: Not on file    Attends religious service: Not on file    Active member of club or organization: Not on file    Attends meetings of clubs or organizations: Not on file    Relationship status: Not on file  . Intimate partner violence:    Fear of  current or ex partner: Not on file    Emotionally abused: Not on file    Physically abused: Not on file    Forced sexual activity: Not on file  Other Topics Concern  . Not on file  Social History Narrative  . Not on file   Health Maintenance  Topic Date Due  . PAP SMEAR  02/14/1997  . INFLUENZA VACCINE  03/19/2018  . TETANUS/TDAP  01/14/2022  . HIV Screening  Completed    The following portions of the patient's history were reviewed and updated as appropriate: allergies, current medications, past family history, past medical history, past social history, past surgical history and problem list.  Review of Systems Pertinent items noted in HPI and remainder of comprehensive ROS otherwise negative.   Objective:    BP (!) 122/55   Pulse 71   Ht 5' 5.5" (1.664 m)   Wt 185 lb (83.9 kg)   BMI 30.32 kg/m  General appearance: alert, cooperative, appears stated age and mildly obese Head: Normocephalic, without obvious abnormality, atraumatic Eyes: conjunctivae/corneas clear. PERRL, EOM's intact. Fundi benign. Ears: normal TM's and external ear canals both ears Nose:  Nares normal. Septum midline. Mucosa normal. No drainage or sinus tenderness. Throat: lips, mucosa, and tongue normal; teeth and gums normal Neck: no adenopathy, no carotid bruit, no JVD, supple, symmetrical, trachea midline and thyroid not enlarged, symmetric, no tenderness/mass/nodules Back: symmetric, no curvature. ROM normal. No CVA tenderness. Lungs: clear to auscultation bilaterally Heart: regular rate and rhythm, S1, S2 normal, no murmur, click, rub or gallop Abdomen: soft, non-tender; bowel sounds normal; no masses,  no organomegaly Extremities: extremities normal, atraumatic, no cyanosis or edema Pulses: 2+ and symmetric Skin: Skin color, texture, turgor normal. No rashes or lesions Lymph nodes: Cervical, supraclavicular, and axillary nodes normal. Neurologic: Alert and oriented X 3, normal strength and tone.  Normal symmetric reflexes. Normal coordination and gait    Assessment:    Healthy female exam.      Plan:    Marland KitchenMarland KitchenVernee was seen today for annual exam.  Diagnoses and all orders for this visit:  Routine physical examination -     Lipid Panel w/reflex Direct LDL -     COMPLETE METABOLIC PANEL WITH GFR -     VITAMIN D 25 Hydroxy (Vit-D Deficiency, Fractures)  Migraine without aura and with status migrainosus, not intractable -     ondansetron (ZOFRAN) 4 MG tablet; Take 1 tablet (4 mg total) by mouth every 8 (eight) hours as needed for nausea or vomiting.  Screening for diabetes mellitus -     COMPLETE METABOLIC PANEL WITH GFR -     POCT glycosylated hemoglobin (Hb A1C)  Screening for lipid disorders -     Lipid Panel w/reflex Direct LDL  Vitamin D deficiency -     VITAMIN D 25 Hydroxy (Vit-D Deficiency, Fractures)  Need for immunization against influenza -     Flu Vaccine QUAD 36+ mos IM  Gastroesophageal reflux disease without esophagitis   .Marland Kitchen Depression screen PHQ 2/9 07/06/2018  Decreased Interest 0  Down, Depressed, Hopeless 0  PHQ - 2 Score 0  Altered sleeping 1  Tired, decreased energy 1  Change in appetite 0  Feeling bad or failure about yourself  0  Trouble concentrating 0  Moving slowly or fidgety/restless 0  Suicidal thoughts 0  PHQ-9 Score 2  Difficult doing work/chores Not difficult at all   .Marland Kitchen Discussed 150 minutes of exercise a week.  Encouraged vitamin D 1000 units and Calcium 1300mg  or 4 servings of dairy a day.  Mammogram ordered.  Pap to be done with GYN. Will get records from Dr. Garwin Brothers.  Fasting labs ordered.   Migraines controlled as needed zofran given prn.   Marland Kitchen.Discussed low carb diet with 1500 calories and 80g of protein.  Exercising at least 150 minutes a week.  [employer] could be a Microbiologist.  Goal BMI is 25.   Acid reflux symptoms. Seems to be very food triggered. Start with avoidance of certain foods. Consider pepcid  or tums as needed. HO given. If continues to be a problem follow up.  See After Visit Summary for Counseling Recommendations

## 2018-07-06 NOTE — Patient Instructions (Addendum)
tums and or pepcid.    Esophagitis Esophagitis is inflammation of the esophagus. The esophagus is the tube that carries food and liquids from your mouth to your stomach. Esophagitis can cause soreness or pain in the esophagus. This condition can make it difficult and painful to swallow. What are the causes? Most causes of esophagitis are not serious. Common causes of this condition include:  Gastroesophageal reflux disease (GERD). This is when stomach contents move back up into the esophagus (reflux).  Repeated vomiting.  An allergic-type reaction, especially caused by food allergies (eosinophilic esophagitis).  Injury to the esophagus by swallowing large pills with or without water, or swallowing certain types of medicines.  Swallowing (ingesting) harmful chemicals, such as household cleaning products.  Heavy alcohol use.  An infection of the esophagus.This most often occurs in people who have a weakened immune system.  Radiation or chemotherapy treatment for cancer.  Certain diseases such as sarcoidosis, Crohn disease, and scleroderma.  What are the signs or symptoms? Symptoms of this condition include:  Difficult or painful swallowing.  Pain with swallowing acidic liquids, such as citrus juices.  Pain with burping.  Chest pain.  Difficulty breathing.  Nausea.  Vomiting.  Pain in the abdomen.  Weight loss.  Ulcers in the mouth.  Patches of white material in the mouth (candidiasis).  Fever.  Coughing up blood or vomiting blood.  Stool that is black, tarry, or bright red.  How is this diagnosed? Your health care provider will take a medical history and perform a physical exam. You may also have other tests, including:  An endoscopy to examine your stomach and esophagus with a small camera.  A test that measures the acidity level in your esophagus.  A test that measures how much pressure is on your esophagus.  A barium swallow or modified barium  swallow to show the shape, size, and functioning of your esophagus.  Allergy tests.  How is this treated? Treatment for this condition depends on the cause of your esophagitis. In some cases, steroids or other medicines may be given to help relieve your symptoms or to treat the underlying cause of your condition. You may have to make some lifestyle changes, such as:  Avoiding alcohol.  Quitting smoking.  Changing your diet.  Exercising.  Changing your sleep habits and your sleep environment.  Follow these instructions at home: Take these actions to decrease your discomfort and to help avoid complications. Diet  Follow a diet as recommended by your health care provider. This may involve avoiding foods and drinks such as: ? Coffee and tea (with or without caffeine). ? Drinks that contain alcohol. ? Energy drinks and sports drinks. ? Carbonated drinks or sodas. ? Chocolate and cocoa. ? Peppermint and mint flavorings. ? Garlic and onions. ? Horseradish. ? Spicy and acidic foods, including peppers, chili powder, curry powder, vinegar, hot sauces, and barbecue sauce. ? Citrus fruit juices and citrus fruits, such as oranges, lemons, and limes. ? Tomato-based foods, such as red sauce, chili, salsa, and pizza with red sauce. ? Fried and fatty foods, such as donuts, french fries, potato chips, and high-fat dressings. ? High-fat meats, such as hot dogs and fatty cuts of red and white meats, such as rib eye steak, sausage, ham, and bacon. ? High-fat dairy items, such as whole milk, butter, and cream cheese.  Eat small, frequent meals instead of large meals.  Avoid drinking large amounts of liquid with your meals.  Avoid eating meals during the 2-3 hours  before bedtime.  Avoid lying down right after you eat.  Do not exercise right after you eat.  Avoid foods and drinks that seem to make your symptoms worse. General instructions  Pay attention to any changes in your  symptoms.  Take over-the-counter and prescription medicines only as told by your health care provider. Do not take aspirin, ibuprofen, or other NSAIDs unless your health care provider told you to do so.  If you have trouble taking pills, use a pill splitter to decrease the size of the pill. This will decrease the chance of the pill getting stuck or injuring your esophagus on the way down. Also, drink water after you take a pill.  Do not use any tobacco products, including cigarettes, chewing tobacco, and e-cigarettes. If you need help quitting, ask your health care provider.  Wear loose-fitting clothing. Do not wear anything tight around your waist that causes pressure on your abdomen.  Raise (elevate) the head of your bed about 6 inches (15 cm).  Try to reduce your stress, such as with yoga or meditation. If you need help reducing stress, ask your health care provider.  If you are overweight, reduce your weight to an amount that is healthy for you. Ask your health care provider for guidance about a safe weight loss goal.  Keep all follow-up visits as told by your health care provider. This is important. Contact a health care provider if:  You have new symptoms.  You have unexplained weight loss.  You have difficulty swallowing, or it hurts to swallow.  You have wheezing or a persistent cough.  Your symptoms do not improve with treatment.  You have frequent heartburn for more than two weeks. Get help right away if:  You have severe pain in your arms, neck, jaw, teeth, or back.  You feel sweaty, dizzy, or light-headed.  You have chest pain or shortness of breath.  You vomit and your vomit looks like blood or coffee grounds.  Your stool is bloody or black.  You have a fever.  You cannot swallow, drink, or eat. This information is not intended to replace advice given to you by your health care provider. Make sure you discuss any questions you have with your health care  provider. Document Released: 09/12/2004 Document Revised: 01/11/2016 Document Reviewed: 11/30/2014 Elsevier Interactive Patient Education  2018 [place] Maintenance, Female Adopting a healthy lifestyle and getting preventive care can go a long way to promote health and wellness. Talk with your health care provider about what schedule of regular examinations is right for you. This is a good chance for you to check in with your provider about disease prevention and staying healthy. In between checkups, there are plenty of things you can do on your own. Experts have done a lot of research about which lifestyle changes and preventive measures are most likely to keep you healthy. Ask your health care provider for more information. Weight and diet Eat a healthy diet  Be sure to include plenty of vegetables, fruits, low-fat dairy products, and lean protein.  Do not eat a lot of foods high in solid fats, added sugars, or salt.  Get regular exercise. This is one of the most important things you can do for your health. ? Most adults should exercise for at least 150 minutes each week. The exercise should increase your heart rate and make you sweat (moderate-intensity exercise). ? Most adults should also do strengthening exercises at least twice a week. This  is in addition to the moderate-intensity exercise.  Maintain a healthy weight  Body mass index (BMI) is a measurement that can be used to identify possible weight problems. It estimates body fat based on height and weight. Your health care provider can help determine your BMI and help you achieve or maintain a healthy weight.  For females 20 years of age and older: ? A BMI below 18.5 is considered underweight. ? A BMI of 18.5 to 24.9 is normal. ? A BMI of 25 to 29.9 is considered overweight. ? A BMI of 30 and above is considered obese.  Watch levels of cholesterol and blood lipids  You should start having your blood tested for  lipids and cholesterol at 42 years of age, then have this test every 5 years.  You may need to have your cholesterol levels checked more often if: ? Your lipid or cholesterol levels are high. ? You are older than 42 years of age. ? You are at high risk for heart disease.  Cancer screening Lung Cancer  Lung cancer screening is recommended for adults 55-24 years old who are at high risk for lung cancer because of a history of smoking.  A yearly low-dose CT scan of the lungs is recommended for people who: ? Currently smoke. ? Have quit within the past 15 years. ? Have at least a 30-pack-year history of smoking. A pack year is smoking an average of one pack of cigarettes a day for 1 year.  Yearly screening should continue until it has been 15 years since you quit.  Yearly screening should stop if you develop a health problem that would prevent you from having lung cancer treatment.  Breast Cancer  Practice breast self-awareness. This means understanding how your breasts normally appear and feel.  It also means doing regular breast self-exams. Let your health care provider know about any changes, no matter how small.  If you are in your 20s or 30s, you should have a clinical breast exam (CBE) by a health care provider every 1-3 years as part of a regular health exam.  If you are 73 or older, have a CBE every year. Also consider having a breast X-ray (mammogram) every year.  If you have a family history of breast cancer, talk to your health care provider about genetic screening.  If you are at high risk for breast cancer, talk to your health care provider about having an MRI and a mammogram every year.  Breast cancer gene (BRCA) assessment is recommended for women who have family members with BRCA-related cancers. BRCA-related cancers include: ? Breast. ? Ovarian. ? Tubal. ? Peritoneal cancers.  Results of the assessment will determine the need for genetic counseling and BRCA1 and  BRCA2 testing.  Cervical Cancer Your health care provider may recommend that you be screened regularly for cancer of the pelvic organs (ovaries, uterus, and vagina). This screening involves a pelvic examination, including checking for microscopic changes to the surface of your cervix (Pap test). You may be encouraged to have this screening done every 3 years, beginning at age 77.  For women ages 51-65, health care providers may recommend pelvic exams and Pap testing every 3 years, or they may recommend the Pap and pelvic exam, combined with testing for human papilloma virus (HPV), every 5 years. Some types of HPV increase your risk of cervical cancer. Testing for HPV may also be done on women of any age with unclear Pap test results.  Other health care providers  may not recommend any screening for nonpregnant women who are considered low risk for pelvic cancer and who do not have symptoms. Ask your health care provider if a screening pelvic exam is right for you.  If you have had past treatment for cervical cancer or a condition that could lead to cancer, you need Pap tests and screening for cancer for at least 20 years after your treatment. If Pap tests have been discontinued, your risk factors (such as having a new sexual partner) need to be reassessed to determine if screening should resume. Some women have medical problems that increase the chance of getting cervical cancer. In these cases, your health care provider may recommend more frequent screening and Pap tests.  Colorectal Cancer  This type of cancer can be detected and often prevented.  Routine colorectal cancer screening usually begins at 42 years of age and continues through 42 years of age.  Your health care provider may recommend screening at an earlier age if you have risk factors for colon cancer.  Your health care provider may also recommend using home test kits to check for hidden blood in the stool.  A small camera at the  end of a tube can be used to examine your colon directly (sigmoidoscopy or colonoscopy). This is done to check for the earliest forms of colorectal cancer.  Routine screening usually begins at age 57.  Direct examination of the colon should be repeated every 5-10 years through 42 years of age. However, you may need to be screened more often if early forms of precancerous polyps or small growths are found.  Skin Cancer  Check your skin from head to toe regularly.  Tell your health care provider about any new moles or changes in moles, especially if there is a change in a mole's shape or color.  Also tell your health care provider if you have a mole that is larger than the size of a pencil eraser.  Always use sunscreen. Apply sunscreen liberally and repeatedly throughout the day.  Protect yourself by wearing long sleeves, pants, a wide-brimmed hat, and sunglasses whenever you are outside.  Heart disease, diabetes, and high blood pressure  High blood pressure causes heart disease and increases the risk of stroke. High blood pressure is more likely to develop in: ? People who have blood pressure in the high end of the normal range (130-139/85-89 mm Hg). ? People who are overweight or obese. ? People who are African American.  If you are 55-90 years of age, have your blood pressure checked every 3-5 years. If you are 63 years of age or older, have your blood pressure checked every year. You should have your blood pressure measured twice-once when you are at a hospital or clinic, and once when you are not at a hospital or clinic. Record the average of the two measurements. To check your blood pressure when you are not at a hospital or clinic, you can use: ? An automated blood pressure machine at a pharmacy. ? A home blood pressure monitor.  If you are between 55 years and 32 years old, ask your health care provider if you should take aspirin to prevent strokes.  Have regular diabetes  screenings. This involves taking a blood sample to check your fasting blood sugar level. ? If you are at a normal weight and have a low risk for diabetes, have this test once every three years after 42 years of age. ? If you are overweight and have  a high risk for diabetes, consider being tested at a younger age or more often. Preventing infection Hepatitis B  If you have a higher risk for hepatitis B, you should be screened for this virus. You are considered at high risk for hepatitis B if: ? You were born in a country where hepatitis B is common. Ask your health care provider which countries are considered high risk. ? Your parents were born in a high-risk country, and you have not been immunized against hepatitis B (hepatitis B vaccine). ? You have HIV or AIDS. ? You use needles to inject street drugs. ? You live with someone who has hepatitis B. ? You have had sex with someone who has hepatitis B. ? You get hemodialysis treatment. ? You take certain medicines for conditions, including cancer, organ transplantation, and autoimmune conditions.  Hepatitis C  Blood testing is recommended for: ? Everyone born from [birth year] through 1965. ? Anyone with known risk factors for hepatitis C.  Sexually transmitted infections (STIs)  You should be screened for sexually transmitted infections (STIs) including gonorrhea and chlamydia if: ? You are sexually active and are younger than 42 years of age. ? You are older than 42 years of age and your health care provider tells you that you are at risk for this type of infection. ? Your sexual activity has changed since you were last screened and you are at an increased risk for chlamydia or gonorrhea. Ask your health care provider if you are at risk.  If you do not have HIV, but are at risk, it may be recommended that you take a prescription medicine daily to prevent HIV infection. This is called pre-exposure prophylaxis (PrEP). You are considered at risk  if: ? You are sexually active and do not regularly use condoms or know the HIV status of your partner(s). ? You take drugs by injection. ? You are sexually active with a partner who has HIV.  Talk with your health care provider about whether you are at high risk of being infected with HIV. If you choose to begin PrEP, you should first be tested for HIV. You should then be tested every 3 months for as long as you are taking PrEP. Pregnancy  If you are premenopausal and you may become pregnant, ask your health care provider about preconception counseling.  If you may become pregnant, take 400 to 800 micrograms (mcg) of folic acid every day.  If you want to prevent pregnancy, talk to your health care provider about birth control (contraception). Osteoporosis and menopause  Osteoporosis is a disease in which the bones lose minerals and strength with aging. This can result in serious bone fractures. Your risk for osteoporosis can be identified using a bone density scan.  If you are 60 years of age or older, or if you are at risk for osteoporosis and fractures, ask your health care provider if you should be screened.  Ask your health care provider whether you should take a calcium or vitamin D supplement to lower your risk for osteoporosis.  Menopause may have certain physical symptoms and risks.  Hormone replacement therapy may reduce some of these symptoms and risks. Talk to your health care provider about whether hormone replacement therapy is right for you. Follow these instructions at home:  Schedule regular health, dental, and eye exams.  Stay current with your immunizations.  Do not use any tobacco products including cigarettes, chewing tobacco, or electronic cigarettes.  If you are pregnant, do not  drink alcohol.  If you are breastfeeding, limit how much and how often you drink alcohol.  Limit alcohol intake to no more than 1 drink per day for nonpregnant women. One drink  equals 12 ounces of beer, 5 ounces of wine, or 1 ounces of hard liquor.  Do not use street drugs.  Do not share needles.  Ask your health care provider for help if you need support or information about quitting drugs.  Tell your health care provider if you often feel depressed.  Tell your health care provider if you have ever been abused or do not feel safe at home. This information is not intended to replace advice given to you by your health care provider. Make sure you discuss any questions you have with your health care provider. Document Released: 02/18/2011 Document Revised: 01/11/2016 Document Reviewed: 05/09/2015 Elsevier Interactive Patient Education  [employer].

## 2018-07-07 ENCOUNTER — Encounter: Payer: Self-pay | Admitting: Physician Assistant

## 2018-07-07 DIAGNOSIS — E7800 Pure hypercholesterolemia, unspecified: Secondary | ICD-10-CM | POA: Insufficient documentation

## 2018-07-07 LAB — COMPLETE METABOLIC PANEL WITH GFR
AG Ratio: 1.8 (calc) (ref 1.0–2.5)
ALBUMIN MSPROF: 4.4 g/dL (ref 3.6–5.1)
ALKALINE PHOSPHATASE (APISO): 38 U/L (ref 33–115)
ALT: 13 U/L (ref 6–29)
AST: 12 U/L (ref 10–30)
BUN: 8 mg/dL (ref 7–25)
CO2: 26 mmol/L (ref 20–32)
CREATININE: 0.77 mg/dL (ref 0.50–1.10)
Calcium: 9.6 mg/dL (ref 8.6–10.2)
Chloride: 105 mmol/L (ref 98–110)
GFR, EST AFRICAN AMERICAN: 110 mL/min/{1.73_m2} (ref >=60)
GFR, Est Non African American: 95 mL/min/{1.73_m2} (ref >=60)
GLUCOSE: 93 mg/dL (ref 65–99)
Globulin: 2.4 g/dL (calc) (ref 1.9–3.7)
Potassium: 4.1 mmol/L (ref 3.5–5.3)
Sodium: 137 mmol/L (ref 135–146)
TOTAL PROTEIN: 6.8 g/dL (ref 6.1–8.1)
Total Bilirubin: 0.5 mg/dL (ref 0.2–1.2)

## 2018-07-07 LAB — LIPID PANEL W/REFLEX DIRECT LDL
Cholesterol: 194 mg/dL (ref <200)
HDL: 56 mg/dL (ref >50)
LDL Cholesterol (Calc): 122 mg/dL (calc) — ABNORMAL HIGH
Non-HDL Cholesterol (Calc): 138 mg/dL (calc) — ABNORMAL HIGH (ref <130)
Total CHOL/HDL Ratio: 3.5 (calc) (ref <5.0)
Triglycerides: 69 mg/dL (ref <150)

## 2018-07-07 LAB — VITAMIN D 25 HYDROXY (VIT D DEFICIENCY, FRACTURES): Vit D, 25-Hydroxy: 22 ng/mL — ABNORMAL LOW (ref 30–100)

## 2018-07-07 NOTE — Progress Notes (Signed)
Call pt: HDL looks good. LDL just mildly elevated. Kidney, liver, glucose look great. Weight loss and avoidance of fried/fatty/processed foods can help decrease LDL. Overall labs look really good.   Vitamin D pending.

## 2018-07-07 NOTE — Progress Notes (Signed)
Call pt: vitamin D still low but better than last recheck. Stay on vitamin D 2000 units daily.

## 2018-08-21 ENCOUNTER — Ambulatory Visit: Payer: BLUE CROSS/BLUE SHIELD | Admitting: Physician Assistant

## 2018-08-21 ENCOUNTER — Ambulatory Visit (INDEPENDENT_AMBULATORY_CARE_PROVIDER_SITE_OTHER): Payer: BLUE CROSS/BLUE SHIELD

## 2018-08-21 ENCOUNTER — Telehealth: Payer: Self-pay

## 2018-08-21 ENCOUNTER — Encounter: Payer: Self-pay | Admitting: Physician Assistant

## 2018-08-21 VITALS — BP 110/60 | HR 67 | Ht 65.5 in | Wt 186.0 lb

## 2018-08-21 DIAGNOSIS — M4802 Spinal stenosis, cervical region: Secondary | ICD-10-CM | POA: Diagnosis not present

## 2018-08-21 DIAGNOSIS — M5412 Radiculopathy, cervical region: Secondary | ICD-10-CM

## 2018-08-21 DIAGNOSIS — R2231 Localized swelling, mass and lump, right upper limb: Secondary | ICD-10-CM

## 2018-08-21 DIAGNOSIS — M542 Cervicalgia: Secondary | ICD-10-CM

## 2018-08-21 DIAGNOSIS — M47812 Spondylosis without myelopathy or radiculopathy, cervical region: Secondary | ICD-10-CM

## 2018-08-21 MED ORDER — CYCLOBENZAPRINE HCL 10 MG PO TABS: 10.0000 mg | ORAL_TABLET | Freq: Three times a day (TID) | ORAL | 0 refills | Status: DC | PRN

## 2018-08-21 MED ORDER — PREDNISONE 50 MG PO TABS: ORAL_TABLET | ORAL | 0 refills | Status: DC

## 2018-08-21 NOTE — Telephone Encounter (Signed)
Fax sent to [place] for copy of pt's latest PAP pathology report

## 2018-08-21 NOTE — Patient Instructions (Signed)
Cervical Strain and Sprain Rehab Ask your health care provider which exercises are safe for you. Do exercises exactly as told by your health care provider and adjust them as directed. It is normal to feel mild stretching, pulling, tightness, or discomfort as you do these exercises, but you should stop right away if you feel sudden pain or your pain gets worse.Do not begin these exercises until told by your health care provider. Stretching and range of motion exercises These exercises warm up your muscles and joints and improve the movement and flexibility of your neck. These exercises also help to relieve pain, numbness, and tingling. Exercise A: Cervical side bend  1. Using good posture, sit on a stable chair or stand up. 2. Without moving your shoulders, slowly tilt your left / right ear to your shoulder until you feel a stretch in your neck muscles. You should be looking straight ahead. 3. Hold for __________ seconds. 4. Repeat with the other side of your neck. Repeat __________ times. Complete this exercise __________ times a day. Exercise B: Cervical rotation  1. Using good posture, sit on a stable chair or stand up. 2. Slowly turn your head to the side as if you are looking over your left / right shoulder. ? Keep your eyes level with the ground. ? Stop when you feel a stretch along the side and the back of your neck. 3. Hold for __________ seconds. 4. Repeat this by turning to your other side. Repeat __________ times. Complete this exercise __________ times a day. Exercise C: Thoracic extension and pectoral stretch 1. Roll a towel or a small blanket so it is about 4 inches (10 cm) in diameter. 2. Lie down on your back on a firm surface. 3. Put the towel lengthwise, under your spine in the middle of your back. It should not be not under your shoulder blades. The towel should line up with your spine from your middle back to your lower back. 4. Put your hands behind your head and let your  elbows fall out to your sides. 5. Hold for __________ seconds. Repeat __________ times. Complete this exercise __________ times a day. Strengthening exercises These exercises build strength and endurance in your neck. Endurance is the ability to use your muscles for a long time, even after your muscles get tired. Exercise D: Upper cervical flexion, isometric 1. Lie on your back with a thin pillow behind your head and a small rolled-up towel under your neck. 2. Gently tuck your chin toward your chest and nod your head down to look toward your feet. Do not lift your head off the pillow. 3. Hold for __________ seconds. 4. Release the tension slowly. Relax your neck muscles completely before you repeat this exercise. Repeat __________ times. Complete this exercise __________ times a day. Exercise E: Cervical extension, isometric  1. Stand about 6 inches (15 cm) away from a wall, with your back facing the wall. 2. Place a soft object, about 6-8 inches (15-20 cm) in diameter, between the back of your head and the wall. A soft object could be a small pillow, a ball, or a folded towel. 3. Gently tilt your head back and press into the soft object. Keep your jaw and forehead relaxed. 4. Hold for __________ seconds. 5. Release the tension slowly. Relax your neck muscles completely before you repeat this exercise. Repeat __________ times. Complete this exercise __________ times a day. Posture and body mechanics Body mechanics refers to the movements and positions of your   body while you do your daily activities. Posture is part of body mechanics. Good posture and healthy body mechanics can help to relieve stress in your body's tissues and joints. Good posture means that your spine is in its natural S-curve position (your spine is neutral), your shoulders are pulled back slightly, and your head is not tipped forward. The following are general guidelines for applying improved posture and body mechanics to your  everyday activities. Standing   When standing, keep your spine neutral and keep your feet about hip-width apart. Keep a slight bend in your knees. Your ears, shoulders, and hips should line up.  When you do a task in which you stand in one place for a long time, place one foot up on a stable object that is 2-4 inches (5-10 cm) high, such as a footstool. This helps keep your spine neutral. Sitting   When sitting, keep your spine neutral and your keep feet flat on the floor. Use a footrest, if necessary, and keep your thighs parallel to the floor. Avoid rounding your shoulders, and avoid tilting your head forward.  When working at a desk or a computer, keep your desk at a height where your hands are slightly lower than your elbows. Slide your chair under your desk so you are close enough to maintain good posture.  When working at a computer, place your monitor at a height where you are looking straight ahead and you do not have to tilt your head forward or downward to look at the screen. Resting When lying down and resting, avoid positions that are most painful for you. Try to support your neck in a neutral position. You can use a contour pillow or a small rolled-up towel. Your pillow should support your neck but not push on it. This information is not intended to replace advice given to you by your health care provider. Make sure you discuss any questions you have with your health care provider. Document Released: 08/05/2005 Document Revised: 04/11/2016 Document Reviewed: 07/12/2015 Elsevier Interactive Patient Education  2019 [employer]  

## 2018-08-21 NOTE — Progress Notes (Signed)
Subjective:    Patient ID: Emily Irwin, female    DOB: 12-27-1975, 43 y.o.   MRN: [id number]  HPI  Pt is a 43 yo female who presents to the clinic with concerns.   She has had a right axilla lump for at least a year. She feels like she is noticing it more and wants it checked out. She mentioned to her OB and said it was nothing to worry about. She does have a up to date mammogram with no abnormalities. No pain and it does not bother her.   She is most concerned with her right arm and into fingers tingling. This has been going on for 2 weeks. 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 The next day her neck and upper back was sore and then progressed into intermittent numbness and tingling radiating into her right arm and index through pinky fingers. Seems to be most aggravated by sleep and lifting. She is taking ibuprofen with little benefit.   .. Active Ambulatory Problems    Diagnosis Date Noted  . Gestational diabetes mellitus 07/11/2014  . Gestational hypertension 07/11/2014  . Obese 07/11/2014  . Migraine without aura and with status migrainosus, not intractable 09/08/2015  . Vitamin D deficiency 09/11/2015  . Overweight 09/11/2015  . Chronic midline low back pain without sciatica 09/09/2016  . Lipoma of upper extremity 09/09/2016  . Gastroesophageal reflux disease without esophagitis 07/06/2018  . Elevated LDL cholesterol level 07/07/2018  . Axillary lump, right 08/21/2018  . Cervical radiculitis 08/24/2018  . Neck pain 08/24/2018   Resolved Ambulatory Problems    Diagnosis Date Noted  . Postpartum care following cesarean delivery and BTL (5/28) 01/14/2012   Past Medical History:  Diagnosis Date  . Anemia 2007  . Diabetes in pregnancy   . Headache(784.0)   . Hypertension in pregnancy 2007     Review of Systems See HPI.     Objective:   Physical Exam Vitals signs reviewed.  Constitutional:      Appearance:  Normal appearance.  HENT:     Head: Normocephalic and atraumatic.  Cardiovascular:     Rate and Rhythm: Normal rate and regular rhythm.     Pulses: Normal pulses.     Heart sounds: Normal heart sounds.  Pulmonary:     Effort: Pulmonary effort is normal.  Musculoskeletal:     Comments: ROM of neck limited to the left due to pain.  No pain over cspine.  Tightness over paraspinal muscles of cervical spine.  Hand grip 5/5.  Upper exterimity strength 5/5.   Negative tinels. Negative phalens.   NROM of shoulder without pain. No pain over shoulder to palpation.   Lymphadenopathy:     Comments: Right axilla non defined area of tissue. Non tender, mobile. No warmth or redness.   Skin:    General: Skin is warm.  Neurological:     General: No focal deficit present.     Mental Status: She is alert and oriented to person, place, and time.  Psychiatric:        Mood and Affect: Mood normal.        Behavior: Behavior normal.           Assessment & Plan:  Marland KitchenMarland KitchenJessalyn was seen today for tingling and numbness.  Diagnoses and all orders for this visit:  Neck pain -     DG Cervical Spine Complete -     predniSONE (  DELTASONE) 50 MG tablet; Take one tablet for 5 days. -     cyclobenzaprine (FLEXERIL) 10 MG tablet; Take 1 tablet (10 mg total) by mouth 3 (three) times daily as needed for muscle spasms.  Cervical radiculitis -     DG Cervical Spine Complete -     predniSONE (DELTASONE) 50 MG tablet; Take one tablet for 5 days. -     cyclobenzaprine (FLEXERIL) 10 MG tablet; Take 1 tablet (10 mg total) by mouth 3 (three) times daily as needed for muscle spasms.  Axillary lump, right  will call to get pap report.   Right axilla lump reassured that appeared to be asscessory fat tissue. No real borders. Present for over a year. Up to date normal mammogram. If continues to change will get u/s.   Appears like pt is having some C7 and C8 radiculopathy. Added prednisone and flexeril. Continue  ibuprofen.  Will get xray.  Consider PT if not improving in next 1-2 weeks.  Consider icing neck, tens unit, biofreeze, icy hot patches and massage.  Follow up as needed or if symptoms worsening.

## 2018-08-24 ENCOUNTER — Encounter: Payer: Self-pay | Admitting: Physician Assistant

## 2018-08-24 DIAGNOSIS — M5412 Radiculopathy, cervical region: Secondary | ICD-10-CM | POA: Insufficient documentation

## 2018-08-24 DIAGNOSIS — M542 Cervicalgia: Secondary | ICD-10-CM | POA: Insufficient documentation

## 2018-08-24 NOTE — Progress Notes (Signed)
Call pt: you do have some narrowing at C3 and 4 level but that does not correlate to where you are having the numbness and tingling.   How are your symptoms today?

## 2018-08-27 ENCOUNTER — Encounter: Payer: Self-pay | Admitting: Physician Assistant

## 2019-02-17 ENCOUNTER — Telehealth: Payer: Self-pay | Admitting: Neurology

## 2019-02-17 DIAGNOSIS — Z20822 Contact with and (suspected) exposure to covid-19: Secondary | ICD-10-CM

## 2019-02-17 NOTE — Addendum Note (Signed)
Addended by: Denyce Robert on: 02/17/2019 04:02 PM   Modules accepted: Orders

## 2019-02-17 NOTE — Telephone Encounter (Signed)
Patient left vm that she just came back from [facility] and has been reading that she should be tested for Covid. Neither her nor anyone in her family are symptomatic, just wants to see if she needs testing. Please advise.

## 2019-02-17 NOTE — Telephone Encounter (Signed)
Patient made aware order has been sent for her to be tested.

## 2019-02-17 NOTE — Telephone Encounter (Signed)
Patient states she arrived home from [facility] 02/16/2019.  She is experiencing no COVID 19 symptoms, but would like to be tested.  Due to her window of exposure scheduled patient for testing 02/22/2019 at 1:30 pm. Testing protocol reviewed with patient.

## 2019-02-22 ENCOUNTER — Other Ambulatory Visit: Payer: BLUE CROSS/BLUE SHIELD

## 2019-02-23 ENCOUNTER — Other Ambulatory Visit: Payer: BC Managed Care – PPO

## 2019-02-23 DIAGNOSIS — R6889 Other general symptoms and signs: Secondary | ICD-10-CM | POA: Diagnosis not present

## 2019-02-27 LAB — NOVEL CORONAVIRUS, NAA: SARS-CoV-2, NAA: NOT DETECTED

## 2019-03-22 ENCOUNTER — Other Ambulatory Visit: Payer: Self-pay

## 2019-03-22 DIAGNOSIS — R6889 Other general symptoms and signs: Secondary | ICD-10-CM | POA: Diagnosis not present

## 2019-03-22 DIAGNOSIS — Z20822 Contact with and (suspected) exposure to covid-19: Secondary | ICD-10-CM

## 2019-03-23 LAB — NOVEL CORONAVIRUS, NAA: SARS-CoV-2, NAA: NOT DETECTED

## 2019-05-25 ENCOUNTER — Other Ambulatory Visit: Payer: Self-pay | Admitting: Physician Assistant

## 2019-05-25 DIAGNOSIS — Z1231 Encounter for screening mammogram for malignant neoplasm of breast: Secondary | ICD-10-CM

## 2019-06-22 ENCOUNTER — Other Ambulatory Visit: Payer: Self-pay

## 2019-06-22 ENCOUNTER — Ambulatory Visit
Admission: RE | Admit: 2019-06-22 | Discharge: 2019-06-22 | Disposition: A | Discharge status: Home / Self Care | Payer: BC Managed Care – PPO | Source: Ambulatory Visit | Attending: Physician Assistant | Admitting: Physician Assistant

## 2019-06-22 DIAGNOSIS — Z1231 Encounter for screening mammogram for malignant neoplasm of breast: Secondary | ICD-10-CM | POA: Diagnosis not present

## 2019-06-22 NOTE — Progress Notes (Signed)
Normal mammogram follow up in 1 year.

## 2019-07-14 ENCOUNTER — Ambulatory Visit (INDEPENDENT_AMBULATORY_CARE_PROVIDER_SITE_OTHER): Payer: BC Managed Care – PPO | Admitting: Physician Assistant

## 2019-07-14 ENCOUNTER — Other Ambulatory Visit: Payer: Self-pay

## 2019-07-14 VITALS — BP 124/74 | HR 85 | Ht 66.0 in | Wt 186.0 lb

## 2019-07-14 DIAGNOSIS — E6609 Other obesity due to excess calories: Secondary | ICD-10-CM

## 2019-07-14 DIAGNOSIS — Z23 Encounter for immunization: Secondary | ICD-10-CM

## 2019-07-14 DIAGNOSIS — Z13220 Encounter for screening for lipoid disorders: Secondary | ICD-10-CM

## 2019-07-14 DIAGNOSIS — Z0000 Encounter for general adult medical examination without abnormal findings: Secondary | ICD-10-CM | POA: Diagnosis not present

## 2019-07-14 DIAGNOSIS — Z6830 Body mass index (BMI) 30.0-30.9, adult: Secondary | ICD-10-CM

## 2019-07-14 DIAGNOSIS — G43001 Migraine without aura, not intractable, with status migrainosus: Secondary | ICD-10-CM

## 2019-07-14 DIAGNOSIS — E559 Vitamin D deficiency, unspecified: Secondary | ICD-10-CM | POA: Diagnosis not present

## 2019-07-14 DIAGNOSIS — Z131 Encounter for screening for diabetes mellitus: Secondary | ICD-10-CM | POA: Diagnosis not present

## 2019-07-14 MED ORDER — ONDANSETRON HCL 4 MG PO TABS: 4.0000 mg | ORAL_TABLET | Freq: Three times a day (TID) | ORAL | 3 refills | Status: DC | PRN

## 2019-07-14 NOTE — Patient Instructions (Signed)
Health Maintenance, Female Adopting a healthy lifestyle and getting preventive care are important in promoting health and wellness. Ask your health care provider about:  The right schedule for you to have regular tests and exams.  Things you can do on your own to prevent diseases and keep yourself healthy. What should I know about diet, weight, and exercise? Eat a healthy diet   Eat a diet that includes plenty of vegetables, fruits, low-fat dairy products, and lean protein.  Do not eat a lot of foods that are high in solid fats, added sugars, or sodium. Maintain a healthy weight Body mass index (BMI) is used to identify weight problems. It estimates body fat based on height and weight. Your health care provider can help determine your BMI and help you achieve or maintain a healthy weight. Get regular exercise Get regular exercise. This is one of the most important things you can do for your health. Most adults should:  Exercise for at least 150 minutes each week. The exercise should increase your heart rate and make you sweat (moderate-intensity exercise).  Do strengthening exercises at least twice a week. This is in addition to the moderate-intensity exercise.  Spend less time sitting. Even light physical activity can be beneficial. Watch cholesterol and blood lipids Have your blood tested for lipids and cholesterol at 43 years of age, then have this test every 5 years. Have your cholesterol levels checked more often if:  Your lipid or cholesterol levels are high.  You are older than 43 years of age.  You are at high risk for heart disease. What should I know about cancer screening? Depending on your health history and family history, you may need to have cancer screening at various ages. This may include screening for:  Breast cancer.  Cervical cancer.  Colorectal cancer.  Skin cancer.  Lung cancer. What should I know about heart disease, diabetes, and high blood  pressure? Blood pressure and heart disease  High blood pressure causes heart disease and increases the risk of stroke. This is more likely to develop in people who have high blood pressure readings, are of African descent, or are overweight.  Have your blood pressure checked: ? Every 3-5 years if you are 18-39 years of age. ? Every year if you are 40 years old or older. Diabetes Have regular diabetes screenings. This checks your fasting blood sugar level. Have the screening done:  Once every three years after age 40 if you are at a normal weight and have a low risk for diabetes.  More often and at a younger age if you are overweight or have a high risk for diabetes. What should I know about preventing infection? Hepatitis B If you have a higher risk for hepatitis B, you should be screened for this virus. Talk with your health care provider to find out if you are at risk for hepatitis B infection. Hepatitis C Testing is recommended for:  Everyone born from 1945 through 1965.  Anyone with known risk factors for hepatitis C. Sexually transmitted infections (STIs)  Get screened for STIs, including gonorrhea and chlamydia, if: ? You are sexually active and are younger than 43 years of age. ? You are older than 43 years of age and your health care provider tells you that you are at risk for this type of infection. ? Your sexual activity has changed since you were last screened, and you are at increased risk for chlamydia or gonorrhea. Ask your health care provider if   you are at risk.  Ask your health care provider about whether you are at high risk for HIV. Your health care provider may recommend a prescription medicine to help prevent HIV infection. If you choose to take medicine to prevent HIV, you should first get tested for HIV. You should then be tested every 3 months for as long as you are taking the medicine. Pregnancy  If you are about to stop having your period (premenopausal) and  you may become pregnant, seek counseling before you get pregnant.  Take 400 to 800 micrograms (mcg) of folic acid every day if you become pregnant.  Ask for birth control (contraception) if you want to prevent pregnancy. Osteoporosis and menopause Osteoporosis is a disease in which the bones lose minerals and strength with aging. This can result in bone fractures. If you are 65 years old or older, or if you are at risk for osteoporosis and fractures, ask your health care provider if you should:  Be screened for bone loss.  Take a calcium or vitamin D supplement to lower your risk of fractures.  Be given hormone replacement therapy (HRT) to treat symptoms of menopause. Follow these instructions at home: Lifestyle  Do not use any products that contain nicotine or tobacco, such as cigarettes, e-cigarettes, and chewing tobacco. If you need help quitting, ask your health care provider.  Do not use street drugs.  Do not share needles.  Ask your health care provider for help if you need support or information about quitting drugs. Alcohol use  Do not drink alcohol if: ? Your health care provider tells you not to drink. ? You are pregnant, may be pregnant, or are planning to become pregnant.  If you drink alcohol: ? Limit how much you use to 0-1 drink a day. ? Limit intake if you are breastfeeding.  Be aware of how much alcohol is in your drink. In the [place] one drink equals one 12 oz bottle of beer (355 mL), one 5 oz glass of wine (148 mL), or one 1 oz glass of hard liquor (44 mL). General instructions  Schedule regular health, dental, and eye exams.  Stay current with your vaccines.  Tell your health care provider if: ? You often feel depressed. ? You have ever been abused or do not feel safe at home. Summary  Adopting a healthy lifestyle and getting preventive care are important in promoting health and wellness.  Follow your health care provider's instructions about healthy  diet, exercising, and getting tested or screened for diseases.  Follow your health care provider's instructions on monitoring your cholesterol and blood pressure. This information is not intended to replace advice given to you by your health care provider. Make sure you discuss any questions you have with your health care provider. Document Released: 02/18/2011 Document Revised: 07/29/2018 Document Reviewed: 07/29/2018 Elsevier Patient Education  2020 Elsevier Inc.  

## 2019-07-14 NOTE — Progress Notes (Signed)
Subjective:     Emily Irwin is a 43 y.o. female and is here for a comprehensive physical exam. The patient reports no problems.   Social History   Socioeconomic History  . Marital status: Married    Spouse name: Not on file  . Number of children: Not on file  . Years of education: Not on file  . Highest education level: Not on file  Occupational History  . Not on file  Social Needs  . Financial resource strain: Not on file  . Food insecurity    Worry: Not on file    Inability: Not on file  . Transportation needs    Medical: Not on file    Non-medical: Not on file  Tobacco Use  . Smoking status: Never Smoker  . Smokeless tobacco: Never Used  Substance and Sexual Activity  . Alcohol use: Yes  . Drug use: No  . Sexual activity: Yes  Lifestyle  . Physical activity    Days per week: Not on file    Minutes per session: Not on file  . Stress: Not on file  Relationships  . Social Herbalist on phone: Not on file    Gets together: Not on file    Attends religious service: Not on file    Active member of club or organization: Not on file    Attends meetings of clubs or organizations: Not on file    Relationship status: Not on file  . Intimate partner violence    Fear of current or ex partner: Not on file    Emotionally abused: Not on file    Physically abused: Not on file    Forced sexual activity: Not on file  Other Topics Concern  . Not on file  Social History Narrative  . Not on file   Health Maintenance  Topic Date Due  . PAP SMEAR-Modifier  10/16/2019  . TETANUS/TDAP  01/14/2022  . INFLUENZA VACCINE  Completed  . HIV Screening  Completed    The following portions of the patient's history were reviewed and updated as appropriate: allergies, current medications, past family history, past medical history, past social history, past surgical history and problem list.  Review of Systems A comprehensive review of systems was negative.    Objective:    BP 124/74   Pulse 85   Ht 5\' 6"  (1.676 m)   Wt 186 lb (84.4 kg)   SpO2 100%   BMI 30.02 kg/m  General appearance: alert, cooperative and appears stated age Head: Normocephalic, without obvious abnormality, atraumatic Eyes: conjunctivae/corneas clear. PERRL, EOM's intact. Fundi benign. Ears: normal TM's and external ear canals both ears Nose: Nares normal. Septum midline. Mucosa normal. No drainage or sinus tenderness. Throat: lips, mucosa, and tongue normal; teeth and gums normal Neck: no adenopathy, no carotid bruit, no JVD, supple, symmetrical, trachea midline and thyroid not enlarged, symmetric, no tenderness/mass/nodules Back: symmetric, no curvature. ROM normal. No CVA tenderness. Lungs: clear to auscultation bilaterally Heart: regular rate and rhythm, S1, S2 normal, no murmur, click, rub or gallop Abdomen: soft, non-tender; bowel sounds normal; no masses,  no organomegaly Extremities: extremities normal, atraumatic, no cyanosis or edema Pulses: 2+ and symmetric Skin: Skin color, texture, turgor normal. No rashes or lesions Lymph nodes: Cervical, supraclavicular, and axillary nodes normal. Neurologic: Alert and oriented X 3, normal strength and tone. Normal symmetric reflexes. Normal coordination and gait   .Marland Kitchen Depression screen [facility] 2/9 07/14/2019 07/06/2018  Decreased Interest 0 0  Down, Depressed, Hopeless 0 0  PHQ - 2 Score 0 0  Altered sleeping 0 1  Tired, decreased energy 1 1  Change in appetite 0 0  Feeling bad or failure about yourself  0 0  Trouble concentrating 0 0  Moving slowly or fidgety/restless 0 0  Suicidal thoughts 0 0  PHQ-9 Score 1 2  Difficult doing work/chores Not difficult at all Not difficult at all    Assessment:    Healthy female exam.      Plan:    Marland KitchenMarland KitchenGagandeep was seen today for annual exam.  Diagnoses and all orders for this visit:  Routine physical examination -     Lipid Panel w/reflex Direct LDL -     COMPLETE  METABOLIC PANEL WITH GFR -     Vitamin D (25 hydroxy) -     TSH -     CBC -     Ferritin  Screening for diabetes mellitus -     COMPLETE METABOLIC PANEL WITH GFR -     Hemoglobin A1c -     Hemoglobin A1c  Screening for lipid disorders -     Lipid Panel w/reflex Direct LDL  Vitamin D deficiency -     Vitamin D (25 hydroxy)  Class 1 obesity due to excess calories without serious comorbidity with body mass index (BMI) of 30.0 to 30.9 in adult -     TSH  Migraine without aura and with status migrainosus, not intractable -     ondansetron (ZOFRAN) 4 MG tablet; Take 1 tablet (4 mg total) by mouth every 8 (eight) hours as needed for nausea or vomiting.  Flu vaccine need -     REGULAR FLU SHOT  .Marland Kitchen Discussed 150 minutes of exercise a week.  Encouraged vitamin D 1000 units and Calcium 1300mg  or 4 servings of dairy a day.  Fasting labs ordered.  Flu shot given.  Mammogram UTD. Will call for copy.  Pap UTD.   See After Visit Summary for Counseling Recommendations

## 2019-07-15 LAB — VITAMIN D 25 HYDROXY (VIT D DEFICIENCY, FRACTURES): Vit D, 25-Hydroxy: 19 ng/mL — ABNORMAL LOW (ref 30–100)

## 2019-07-15 LAB — LIPID PANEL W/REFLEX DIRECT LDL
Cholesterol: 205 mg/dL — ABNORMAL HIGH (ref <200)
HDL: 63 mg/dL (ref >=50)
LDL Cholesterol (Calc): 122 mg/dL (calc) — ABNORMAL HIGH
Non-HDL Cholesterol (Calc): 142 mg/dL (calc) — ABNORMAL HIGH (ref <130)
Total CHOL/HDL Ratio: 3.3 (calc) (ref <5.0)
Triglycerides: 96 mg/dL (ref <150)

## 2019-07-15 LAB — COMPLETE METABOLIC PANEL WITH GFR
AG Ratio: 1.8 (calc) (ref 1.0–2.5)
ALT: 12 U/L (ref 6–29)
AST: 13 U/L (ref 10–30)
Albumin: 4.4 g/dL (ref 3.6–5.1)
Alkaline phosphatase (APISO): 32 U/L (ref 31–125)
BUN: 10 mg/dL (ref 7–25)
CO2: 25 mmol/L (ref 20–32)
Calcium: 9.4 mg/dL (ref 8.6–10.2)
Chloride: 103 mmol/L (ref 98–110)
Creat: 0.65 mg/dL (ref 0.50–1.10)
GFR, Est African American: 126 mL/min/{1.73_m2} (ref >=60)
GFR, Est Non African American: 109 mL/min/{1.73_m2} (ref >=60)
Globulin: 2.5 g/dL (calc) (ref 1.9–3.7)
Glucose, Bld: 88 mg/dL (ref 65–99)
Potassium: 3.9 mmol/L (ref 3.5–5.3)
Sodium: 137 mmol/L (ref 135–146)
Total Bilirubin: 0.5 mg/dL (ref 0.2–1.2)
Total Protein: 6.9 g/dL (ref 6.1–8.1)

## 2019-07-15 LAB — TSH: TSH: 1.69 mIU/L

## 2019-07-15 LAB — HEMOGLOBIN A1C
Hgb A1c MFr Bld: 5.2 % of total Hgb (ref <5.7)
Mean Plasma Glucose: 103 (calc)
eAG (mmol/L): 5.7 (calc)

## 2019-07-15 LAB — CBC
HCT: 40 % (ref 35.0–45.0)
Hemoglobin: 13.3 g/dL (ref 11.7–15.5)
MCH: 30.2 pg (ref 27.0–33.0)
MCHC: 33.3 g/dL (ref 32.0–36.0)
MCV: 90.7 fL (ref 80.0–100.0)
MPV: 11.8 fL (ref 7.5–12.5)
Platelets: 246 10*3/uL (ref 140–400)
RBC: 4.41 10*6/uL (ref 3.80–5.10)
RDW: 11.4 % (ref 11.0–15.0)
WBC: 4.3 10*3/uL (ref 3.8–10.8)

## 2019-07-15 LAB — FERRITIN: Ferritin: 32 ng/mL (ref 16–232)

## 2019-07-17 ENCOUNTER — Encounter: Payer: Self-pay | Admitting: Physician Assistant

## 2019-07-18 NOTE — Progress Notes (Signed)
Emily Irwin,   HDL improved. Great news. LDL stable. TG stable. Kidney, liver, glucose look good.a1c is 5.2 normal but up a little from last year.  Vitamin D very low. How much vitamin D are you taking and how are you taking it? Thyroid looks good.

## 2019-12-18 DIAGNOSIS — Z03818 Encounter for observation for suspected exposure to other biological agents ruled out: Secondary | ICD-10-CM | POA: Diagnosis not present

## 2019-12-18 DIAGNOSIS — Z20828 Contact with and (suspected) exposure to other viral communicable diseases: Secondary | ICD-10-CM | POA: Diagnosis not present

## 2020-03-22 DIAGNOSIS — Z20822 Contact with and (suspected) exposure to covid-19: Secondary | ICD-10-CM | POA: Diagnosis not present

## 2020-10-16 ENCOUNTER — Other Ambulatory Visit: Payer: Self-pay

## 2020-10-16 ENCOUNTER — Ambulatory Visit: Payer: Self-pay

## 2020-10-16 ENCOUNTER — Ambulatory Visit: Payer: BC Managed Care – PPO | Admitting: Orthopaedic Surgery

## 2020-10-16 DIAGNOSIS — M7061 Trochanteric bursitis, right hip: Secondary | ICD-10-CM | POA: Diagnosis not present

## 2020-10-16 DIAGNOSIS — G8929 Other chronic pain: Secondary | ICD-10-CM | POA: Diagnosis not present

## 2020-10-16 DIAGNOSIS — M5441 Lumbago with sciatica, right side: Secondary | ICD-10-CM

## 2020-10-16 DIAGNOSIS — M7631 Iliotibial band syndrome, right leg: Secondary | ICD-10-CM

## 2020-10-16 MED ORDER — LIDOCAINE HCL 1 % IJ SOLN
3.0000 mL | INTRAMUSCULAR | Status: AC | PRN
  Administered 2020-10-16: 3 mL

## 2020-10-16 MED ORDER — METHYLPREDNISOLONE ACETATE 40 MG/ML IJ SUSP
40.0000 mg | INTRAMUSCULAR | Status: AC | PRN
  Administered 2020-10-16: 40 mg via INTRA_ARTICULAR

## 2020-10-16 NOTE — Progress Notes (Signed)
Office Visit Note   Patient: Emily Irwin           Date of Birth: 1976/02/27           MRN: [id number] Visit Date: 10/16/2020              Requested by: Donella Stade, PA-C [address] [place],  [place] [address] PCP: Donella Stade, PA-C   Assessment & Plan: Visit Diagnoses:  1. Chronic midline low back pain with right-sided sciatica   2. Trochanteric bursitis, right hip   3. It band syndrome, right     Plan: I did show her a spine model as well as talk about her hip.  She does have significant facet joint low back pain but also significant trochanteric bursitis and IT band pain on the right side.  I talked her about stretching exercises and showed her how to do these.  I recommended a topical Voltaren gel for her back and her trochanteric area and IT band on the right side.  I did talk about trying a steroid injection on the right hip.  She agreed to this and tolerated it well.  She is a good candidate for outpatient physical therapy for her back and her hip.  We talked about exercises and things to avoid.  All questions and concerns were answered and addressed.  We will work on setting her up for outpatient physical therapy and then see her back in about 6 weeks.  Follow-Up Instructions: Return in about 6 weeks (around 11/27/2020).   Orders:  Orders Placed This Encounter  Procedures  . Large Joint Inj  . XR Lumbar Spine 2-3 Views   No orders of the defined types were placed in this encounter.     Procedures: Large Joint Inj: R greater trochanter on 10/16/2020 9:42 AM Indications: pain and diagnostic evaluation Details: 22 G 1.5 in needle, lateral approach  Arthrogram: No  Medications: 3 mL lidocaine 1 %; 40 mg methylPREDNISolone acetate 40 MG/ML Outcome: tolerated well, no immediate complications Procedure, treatment alternatives, risks and benefits explained, specific risks discussed. Consent was given by the patient. Immediately prior to  procedure a time out was called to verify the correct patient, procedure, equipment, support staff and site/side marked as required. Patient was prepped and draped in the usual sterile fashion.       Clinical Data: No additional findings.   Subjective: Chief Complaint  Patient presents with  . Lower Back - Pain  The patient is a very pleasant 45 year old female who comes in with a several year history of low back pain but also right-sided hip pain and pain going down her leg to her knee on the right side.  She denies any numbness and tingling.  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  She lays on the right side at night and gets a lot of pain lying down on that side as well.  She denies any groin pain.  She is taking Advil on occasion and occasionally uses CBD.  She is not a diabetic.  She has never had surgery on her back or hip.  She denies any change in bowel bladder function.  HPI  Review of Systems There is currently listed no headache, chest pain, shortness of breath, fever, chills, nausea, vomiting  Objective: Vital Signs: There  were no vitals taken for this visit.  Physical Exam She is alert and orient x3 and in no acute distress Ortho Exam Examination of her right hip shows it moves smoothly and fluidly.  She has severe pain to palpation over the proximal trochanteric area and proximal IT band as well as radiating down the IT band.  She has low back pain with flexion extension especially with extension.  She has excellent range of motion of her spine and can touch her toes.  She has good strength in her both lower extremities and normal sensation. Specialty Comments:  No specialty comments available.  Imaging: XR Lumbar Spine 2-3 Views  Result Date: 10/16/2020 2 views of the lumbar spine showed no acute findings.  The alignment is well-maintained.    PMFS  History: Patient Active Problem List   Diagnosis Date Noted  . Cervical radiculitis 08/24/2018  . Neck pain 08/24/2018  . Axillary lump, right 08/21/2018  . Elevated LDL cholesterol level 07/07/2018  . Gastroesophageal reflux disease without esophagitis 07/06/2018  . Chronic midline low back pain without sciatica 09/09/2016  . Lipoma of upper extremity 09/09/2016  . Vitamin D deficiency 09/11/2015  . Overweight 09/11/2015  . Migraine without aura and with status migrainosus, not intractable 09/08/2015  . Gestational diabetes mellitus 07/11/2014  . Gestational hypertension 07/11/2014  . Obese 07/11/2014   Past Medical History:  Diagnosis Date  . Anemia 2007   hemorrhaged during delivery  . Diabetes in pregnancy    DIET CONTROLLED  . Headache(784.0)    migraines  . Hypertension in pregnancy 2007    Family History  Problem Relation Age of Onset  . Diabetes Maternal Grandmother   . Congestive Heart Failure Maternal Grandmother   . Diabetes Paternal Grandfather   . Diabetes Mother   . Hypertension Father   . Alcohol abuse Maternal Uncle   . Alcohol abuse Maternal Grandfather   . Congestive Heart Failure Maternal Grandfather     Past Surgical History:  Procedure Laterality Date  . KNEE ARTHROSCOPY  2007   Social History   Occupational History  . Not on file  Tobacco Use  . Smoking status: Never Smoker  . Smokeless tobacco: Never Used  Substance and Sexual Activity  . Alcohol use: Yes  . Drug use: No  . Sexual activity: Yes

## 2020-11-02 ENCOUNTER — Encounter: Payer: Self-pay | Admitting: Physical Therapy

## 2020-11-02 ENCOUNTER — Ambulatory Visit (INDEPENDENT_AMBULATORY_CARE_PROVIDER_SITE_OTHER): Payer: BC Managed Care – PPO | Admitting: Physical Therapy

## 2020-11-02 ENCOUNTER — Other Ambulatory Visit: Payer: Self-pay

## 2020-11-02 DIAGNOSIS — M6281 Muscle weakness (generalized): Secondary | ICD-10-CM

## 2020-11-02 DIAGNOSIS — R29898 Other symptoms and signs involving the musculoskeletal system: Secondary | ICD-10-CM

## 2020-11-02 DIAGNOSIS — M545 Low back pain, unspecified: Secondary | ICD-10-CM | POA: Diagnosis not present

## 2020-11-02 DIAGNOSIS — M25551 Pain in right hip: Secondary | ICD-10-CM | POA: Diagnosis not present

## 2020-11-02 DIAGNOSIS — G8929 Other chronic pain: Secondary | ICD-10-CM

## 2020-11-02 NOTE — Patient Instructions (Signed)
Access Code: [email or web address] URL: [email or web address] Date: 11/02/2020 Prepared by: Faustino Congress  Exercises Supine Piriformis Stretch with Foot on Ground - 2 x daily - 7 x weekly - 1 sets - 5 reps - 30 sec hold Supine Piriformis Stretch with Leg Straight - 2 x daily - 7 x weekly - 1 sets - 10 reps - 5-10 sec hold ITB Stretch at Wall - 2 x daily - 7 x weekly - 1 sets - 5 reps - 30 sec hold Clam - 2 x daily - 7 x weekly - 2 sets - 10 reps Supine Bridge - 2 x daily - 7 x weekly - 10 reps - 1 sets - 5 sec hold  Patient Education Trigger Point Dry Needling

## 2020-11-02 NOTE — Therapy (Signed)
[place], [place], [address] Phone: [phone]   Fax:  [phone]  Physical Therapy Evaluation  Patient Details  Name: Emily Irwin MRN: [id number] Date of Birth: 10-Mar-1976 Referring Provider (PT): Mcarthur Rossetti, MD   Encounter Date: 11/02/2020   PT End of Session - 11/02/20 1244    Visit Number 1    Number of Visits 12    Date for PT Re-Evaluation 12/14/20    Authorization - Number of Visits 30    PT Start Time 1147    PT Stop Time 1235    PT Time Calculation (min) 48 min    Activity Tolerance Patient tolerated treatment well    Behavior During Therapy [facility] for tasks assessed/performed           Past Medical History:  Diagnosis Date  . Anemia 2007   hemorrhaged during delivery  . Diabetes in pregnancy    DIET CONTROLLED  . Headache(784.0)    migraines  . Hypertension in pregnancy 2007    Past Surgical History:  Procedure Laterality Date  . KNEE ARTHROSCOPY  2007    There were no vitals filed for this visit.    Subjective Assessment - 11/02/20 1152    Subjective Pt is a 45 y/o female who presents to OPPT for chronic Rt hip pain.  She reports initial flare up about a year ago and pain resolved after a few months.  She reports sudden onset x 2 months and is unable to get pain to improve.  Steroid injection was helpful x 2 days but pain is slowly returning.    Patient Stated Goals improve pain, sleep better    Currently in Pain? Yes    Pain Score 2    up to 5/10; at best 0/10   Pain Location Hip    Pain Orientation Right    Pain Descriptors / Indicators Aching;Sharp    Pain Type Acute pain    Pain Radiating Towards Rt lateral knee    Pain Onset More than a month ago    Pain Frequency Intermittent    Aggravating Factors  pressure (lying on Rt side), sit to stand    Pain Relieving Factors CBD oil              [facility] PT Assessment - 11/02/20 1150      Assessment   Medical  Diagnosis M54.41,G89.29 (ICD-10-CM) - Chronic midline low back pain with right-sided sciatica  M70.61 (ICD-10-CM) - Trochanteric bursitis, right hip  M76.31 (ICD-10-CM) - It band syndrome, right    Referring Provider (PT) Mcarthur Rossetti, MD    Onset Date/Surgical Date --   2 months ago   Hand Dominance Right    Next MD Visit 11/27/20    Prior Therapy none      Precautions   Precautions None      Restrictions   Weight Bearing Restrictions No      Balance Screen   Has the patient fallen in the past 6 months No    Has the patient had a decrease in activity level because of a fear of falling?  No    Is the patient reluctant to leave their home because of a fear of falling?  No      Home Environment   Living Environment Private residence    Living Arrangements Spouse/significant other;Children   73 and 8 y/o children   Type of Home House    Additional Comments denies difficulty with  stairs      Prior Function   Level of Independence Independent    Vocation Works at home    [employer] sits a a desk all day - has flexibility with schedule    Leisure gardening (flowers), spending time with children, church; no regular exercise      Cognition   Overall Cognitive Status Within Functional Limits for tasks assessed      Observation/Other Assessments   Focus on Therapeutic Outcomes (FOTO)  63 (predicted 76)      ROM / Strength   AROM / PROM / Strength AROM;Strength      AROM   Overall AROM Comments bil hip grossly WNL      Strength   Strength Assessment Site Hip    Right/Left Hip Right;Left    Right Hip Flexion 5/5    Right Hip Extension 3+/5    Right Hip External Rotation  4/5    Right Hip Internal Rotation 4/5    Left Hip Flexion 5/5    Left Hip Extension 4/5    Left Hip External Rotation 4+/5    Left Hip Internal Rotation 4+/5      Palpation   Palpation comment tenderness at greater trochanter; trigger points in glute min and TFL on Rt      Special  Tests    Special Tests Hip Special Tests    Hip Special Tests  Saralyn Pilar (FABER) Test;Hip Scouring      Saralyn Pilar (FABER) Test   Findings Negative      Hip Scouring   Findings Negative                      Objective measurements completed on examination: See above findings.       [place] Adult PT Treatment/Exercise - 11/02/20 0001      Exercises   Exercises Other Exercises    Other Exercises  see pt instructions - pt performed 1-3 reps of each exercise      Manual Therapy   Manual Therapy Soft tissue mobilization    Soft tissue mobilization Rt glute min with compression            Trigger Point Dry Needling - 11/02/20 0001    Consent Given? Yes    Education Handout Provided Yes    Muscles Treated Back/Hip Gluteus minimus    Gluteus Minimus Response Twitch response elicited                PT Education - 11/02/20 1244    Education Details HEP, DN    Person(s) Educated Patient    Methods Explanation;Demonstration;Handout    Comprehension Verbalized understanding;Returned demonstration;Need further instruction            PT Short Term Goals - 11/02/20 1248      PT SHORT TERM GOAL #1   Title Independent with initial HEP    Status New    Target Date 11/23/20             PT Long Term Goals - 11/02/20 1248      PT LONG TERM GOAL #1   Title Independent with final HEP    Status New    Target Date 12/14/20      PT LONG TERM GOAL #2   Title FOTO score improved to 76 for improved function    Status New    Target Date 12/14/20      PT LONG TERM GOAL #3   Title Demonstrate at least  4+/5 strength in Rt hip for improved mobility and function    Status New    Target Date 12/14/20      PT LONG TERM GOAL #4   Title Report pain < 3/10 with sit to stand for improved functional mobility    Status New    Target Date 12/14/20      PT LONG TERM GOAL #5   Title report 75% improvement in sleep quality    Status New    Target Date 12/14/20                   Plan - 11/02/20 1245    Clinical Impression Statement Pt is a 45 y/o female who presents to OPPT for chronic LBP and acute exacerbation of Rt hip pain.  She demonstrates decreased strength and flexibility as well as pain with mobility affecting function.  Will benefit from PT to address deficits listed.    Personal Factors and Comorbidities Comorbidity 1    Comorbidities migraines    Examination-Activity Limitations Sleep;Bed Mobility;Stairs;Stand;Transfers;Locomotion Level    Examination-Participation Restrictions Church;Community Activity;Yard Work;Occupation    Stability/Clinical Decision Making Stable/Uncomplicated    Clinical Decision Making Low    Rehab Potential Good    PT Frequency 2x / week    PT Duration 6 weeks    PT Treatment/Interventions ADLs/Self Care Home Management;Cryotherapy;Electrical Stimulation;Iontophoresis 4mg /ml Dexamethasone;Moist Heat;Balance training;Therapeutic exercise;Therapeutic activities;Functional mobility training;Stair training;Gait training;Neuromuscular re-education;Patient/family education;Manual techniques;Taping;Dry needling    PT Next Visit Plan review HEP and progress strengthening exercises as able, manual/modalities PRN, assess response to DN    PT Home Exercise Plan Access Code: [email or web address]    Consulted and Agree with Plan of Care Patient           Patient will benefit from skilled therapeutic intervention in order to improve the following deficits and impairments:  Increased fascial restricitons,Increased muscle spasms,Pain,Decreased mobility,Decreased strength,Impaired flexibility  Visit Diagnosis: Pain in right hip - Plan: PT plan of care cert/re-cert  Muscle weakness (generalized) - Plan: PT plan of care cert/re-cert  Other symptoms and signs involving the musculoskeletal system - Plan: PT plan of care cert/re-cert  Chronic bilateral low back pain without sciatica - Plan: PT plan of care cert/re-cert     Problem  List Patient Active Problem List   Diagnosis Date Noted  . Cervical radiculitis 08/24/2018  . Neck pain 08/24/2018  . Axillary lump, right 08/21/2018  . Elevated LDL cholesterol level 07/07/2018  . Gastroesophageal reflux disease without esophagitis 07/06/2018  . Chronic midline low back pain without sciatica 09/09/2016  . Lipoma of upper extremity 09/09/2016  . Vitamin D deficiency 09/11/2015  . Overweight 09/11/2015  . Migraine without aura and with status migrainosus, not intractable 09/08/2015  . Gestational diabetes mellitus 07/11/2014  . Gestational hypertension 07/11/2014  . Obese 07/11/2014      Laureen Abrahams, PT, DPT 11/02/20 12:52 PM     [facility] [address] [place], [place], [address] Phone: [phone]   Fax:  [phone]  Name: TAILOR LUCKING MRN: [id number] Date of Birth: 1976-01-09

## 2020-11-22 ENCOUNTER — Encounter: Payer: BC Managed Care – PPO | Admitting: Physical Therapy

## 2020-11-23 ENCOUNTER — Encounter: Payer: BC Managed Care – PPO | Admitting: Physical Therapy

## 2020-11-27 ENCOUNTER — Ambulatory Visit: Payer: BC Managed Care – PPO | Admitting: Orthopaedic Surgery

## 2020-11-30 ENCOUNTER — Encounter: Payer: Self-pay | Admitting: Physical Therapy

## 2020-11-30 ENCOUNTER — Other Ambulatory Visit: Payer: Self-pay

## 2020-11-30 ENCOUNTER — Ambulatory Visit (INDEPENDENT_AMBULATORY_CARE_PROVIDER_SITE_OTHER): Payer: BC Managed Care – PPO | Admitting: Physical Therapy

## 2020-11-30 DIAGNOSIS — R29898 Other symptoms and signs involving the musculoskeletal system: Secondary | ICD-10-CM

## 2020-11-30 DIAGNOSIS — M25551 Pain in right hip: Secondary | ICD-10-CM

## 2020-11-30 DIAGNOSIS — M545 Low back pain, unspecified: Secondary | ICD-10-CM

## 2020-11-30 DIAGNOSIS — M6281 Muscle weakness (generalized): Secondary | ICD-10-CM

## 2020-11-30 DIAGNOSIS — G8929 Other chronic pain: Secondary | ICD-10-CM

## 2020-11-30 NOTE — Therapy (Signed)
[place], [place], [address] Phone: [phone]   Fax:  [phone]  Physical Therapy Treatment  Patient Details  Name: Emily Irwin MRN: [id number] Date of Birth: Jan 06, 1976 Referring Provider (PT): Mcarthur Rossetti, MD   Encounter Date: 11/30/2020   PT End of Session - 11/30/20 1251    Visit Number 2    Number of Visits 12    Date for PT Re-Evaluation 12/14/20    Authorization - Number of Visits 30    PT Start Time 1157   pt arrived late   PT Stop Time 1233    PT Time Calculation (min) 36 min    Activity Tolerance Patient tolerated treatment well    Behavior During Therapy [facility] for tasks assessed/performed           Past Medical History:  Diagnosis Date  . Anemia 2007   hemorrhaged during delivery  . Diabetes in pregnancy    DIET CONTROLLED  . Headache(784.0)    migraines  . Hypertension in pregnancy 2007    Past Surgical History:  Procedure Laterality Date  . KNEE ARTHROSCOPY  2007    There were no vitals filed for this visit.   Subjective Assessment - 11/30/20 1159    Subjective Rt hip is better but still having a lot of pain with pressure/sitting and sleeping.    Patient Stated Goals improve pain, sleep better    Currently in Pain? No/denies                             [facility] Adult PT Treatment/Exercise - 11/30/20 1200      Exercises   Exercises Knee/Hip      Knee/Hip Exercises: Stretches   Other Knee/Hip Stretches ITB stretch standing and supine with cues for technique - pt to continue to try standing option for home    Other Knee/Hip Stretches use of tennis ball for glute med/min      Knee/Hip Exercises: Aerobic   Recumbent Bike L3 x 6 min      Manual Therapy   Soft tissue mobilization bil glute med with compression            Trigger Point Dry Needling - 11/30/20 1251    Consent Given? Yes    Education Handout Provided Previously provided     Muscles Treated Back/Hip Gluteus medius    Gluteus Minimus Response Twitch response elicited                  PT Short Term Goals - 11/30/20 1251      PT SHORT TERM GOAL #1   Title Independent with initial HEP    Status Achieved    Target Date 11/23/20             PT Long Term Goals - 11/30/20 1251      PT LONG TERM GOAL #1   Title Independent with final HEP    Status On-going    Target Date 12/14/20      PT LONG TERM GOAL #2   Title FOTO score improved to 76 for improved function    Status On-going      PT LONG TERM GOAL #3   Title Demonstrate at least 4+/5 strength in Rt hip for improved mobility and function    Status On-going      PT LONG TERM GOAL #4   Title Report pain < 3/10 with sit to  stand for improved functional mobility    Status On-going      PT LONG TERM GOAL #5   Title report 75% improvement in sleep quality    Status On-going                 Plan - 11/30/20 1252    Clinical Impression Statement Pt reporting some improvement but continues to have increased pain with pressure on Rt hip.  She had a positive response from DN last session, so repeated today with areas of trigger points.  Will continue to benefit from PT to maximize function.  Pt independent and compliant with current HEP meeting STG #1.    Personal Factors and Comorbidities Comorbidity 1    Comorbidities migraines    Examination-Activity Limitations Sleep;Bed Mobility;Stairs;Stand;Transfers;Locomotion Level    Examination-Participation Restrictions Church;Community Activity;Yard Work;Occupation    Stability/Clinical Decision Making Stable/Uncomplicated    Rehab Potential Good    PT Frequency 2x / week    PT Duration 6 weeks    PT Treatment/Interventions ADLs/Self Care Home Management;Cryotherapy;Electrical Stimulation;Iontophoresis 4mg /ml Dexamethasone;Moist Heat;Balance training;Therapeutic exercise;Therapeutic activities;Functional mobility training;Stair training;Gait  training;Neuromuscular re-education;Patient/family education;Manual techniques;Taping;Dry needling    PT Next Visit Plan progress strengthening exercises as able, manual/modalities PRN, assess response to DN    PT Home Exercise Plan Access Code: [email or web address]    Consulted and Agree with Plan of Care Patient           Patient will benefit from skilled therapeutic intervention in order to improve the following deficits and impairments:  Increased fascial restricitons,Increased muscle spasms,Pain,Decreased mobility,Decreased strength,Impaired flexibility  Visit Diagnosis: Pain in right hip  Muscle weakness (generalized)  Other symptoms and signs involving the musculoskeletal system  Chronic bilateral low back pain without sciatica     Problem List Patient Active Problem List   Diagnosis Date Noted  . Cervical radiculitis 08/24/2018  . Neck pain 08/24/2018  . Axillary lump, right 08/21/2018  . Elevated LDL cholesterol level 07/07/2018  . Gastroesophageal reflux disease without esophagitis 07/06/2018  . Chronic midline low back pain without sciatica 09/09/2016  . Lipoma of upper extremity 09/09/2016  . Vitamin D deficiency 09/11/2015  . Overweight 09/11/2015  . Migraine without aura and with status migrainosus, not intractable 09/08/2015  . Gestational diabetes mellitus 07/11/2014  . Gestational hypertension 07/11/2014  . Obese 07/11/2014      Laureen Abrahams, PT, DPT 11/30/20 12:54 PM    [facility] [address] [place], [place], [address] Phone: [phone]   Fax:  [phone]  Name: Emily Irwin MRN: [id number] Date of Birth: 10-05-1975

## 2020-12-11 ENCOUNTER — Encounter: Payer: BC Managed Care – PPO | Admitting: Physical Therapy

## 2020-12-18 ENCOUNTER — Ambulatory Visit (INDEPENDENT_AMBULATORY_CARE_PROVIDER_SITE_OTHER): Payer: BC Managed Care – PPO | Admitting: Physical Therapy

## 2020-12-18 ENCOUNTER — Encounter: Payer: Self-pay | Admitting: Physical Therapy

## 2020-12-18 ENCOUNTER — Other Ambulatory Visit: Payer: Self-pay

## 2020-12-18 DIAGNOSIS — R29898 Other symptoms and signs involving the musculoskeletal system: Secondary | ICD-10-CM | POA: Diagnosis not present

## 2020-12-18 DIAGNOSIS — M6281 Muscle weakness (generalized): Secondary | ICD-10-CM | POA: Diagnosis not present

## 2020-12-18 DIAGNOSIS — M545 Low back pain, unspecified: Secondary | ICD-10-CM | POA: Diagnosis not present

## 2020-12-18 DIAGNOSIS — G8929 Other chronic pain: Secondary | ICD-10-CM

## 2020-12-18 DIAGNOSIS — M25551 Pain in right hip: Secondary | ICD-10-CM

## 2020-12-18 NOTE — Therapy (Addendum)
[place], [place], [address] Phone: [phone]   Fax:  [phone]  Physical Therapy Treatment/Discharge  Patient Details  Name: Emily Irwin MRN: [id number] Date of Birth: 1975-11-03 Referring Provider (PT): Mcarthur Rossetti, MD   Encounter Date: 12/18/2020   PT End of Session - 12/18/20 1309    Visit Number 3    Number of Visits 12    Date for PT Re-Evaluation 12/14/20    Authorization - Number of Visits 30    PT Start Time 1150    PT Stop Time 1231    PT Time Calculation (min) 41 min    Activity Tolerance Patient tolerated treatment well    Behavior During Therapy [facility] for tasks assessed/performed           Past Medical History:  Diagnosis Date  . Anemia 2007   hemorrhaged during delivery  . Diabetes in pregnancy    DIET CONTROLLED  . Headache(784.0)    migraines  . Hypertension in pregnancy 2007    Past Surgical History:  Procedure Laterality Date  . KNEE ARTHROSCOPY  2007    There were no vitals filed for this visit.   Subjective Assessment - 12/18/20 1152    Subjective still having pain with getting up after sitting, and increased pain walking through the zoo    Patient Stated Goals improve pain, sleep better    Currently in Pain? No/denies              [facility] PT Assessment - 12/18/20 1156      Assessment   Medical Diagnosis M54.41,G89.29 (ICD-10-CM) - Chronic midline low back pain with right-sided sciatica  M70.61 (ICD-10-CM) - Trochanteric bursitis, right hip  M76.31 (ICD-10-CM) - It band syndrome, right    Referring Provider (PT) Mcarthur Rossetti, MD    Hand Dominance Right      Observation/Other Assessments   Focus on Therapeutic Outcomes (FOTO)  72      Special Tests    Special Tests Lumbar    Lumbar Tests Slump Test      Slump test   Findings Negative      Saralyn Pilar (FABER) Test   Findings Negative      Hip Scouring   Findings Negative                          [facility] Adult PT Treatment/Exercise - 12/18/20 1152      Knee/Hip Exercises: Aerobic   Recumbent Bike L4 x 8 min      Manual Therapy   Soft tissue mobilization Rt TFL compression; supine and sidelying manual hip flexor and IT band stretch            Trigger Point Dry Needling - 12/18/20 0001    Consent Given? Yes    Education Handout Provided Previously provided    Muscles Treated Back/Hip Tensor fascia lata    Tensor Fascia Lata Response Twitch response elicited                  PT Short Term Goals - 11/30/20 1251      PT SHORT TERM GOAL #1   Title Independent with initial HEP    Status Achieved    Target Date 11/23/20             PT Long Term Goals - 12/18/20 1307      PT LONG TERM GOAL #1   Title Independent with final  HEP    Status On-going    Target Date 01/15/21      PT LONG TERM GOAL #2   Title FOTO score improved to 76 for improved function    Status On-going    Target Date 01/15/21      PT LONG TERM GOAL #3   Title Demonstrate at least 4+/5 strength in Rt hip for improved mobility and function    Status On-going    Target Date 01/15/21      PT LONG TERM GOAL #4   Title Report pain < 3/10 with sit to stand for improved functional mobility    Status Partially Met    Target Date 01/15/21      PT LONG TERM GOAL #5   Title report 75% improvement in sleep quality    Baseline 5/2: 80% improved    Status Achieved                 Plan - 12/18/20 1307    Clinical Impression Statement Pt has met 1 LTG and has demonstrated progress towards other LTGs.  At this time we will plan to hold PT until after MD follow up (missed originally scheduled f/u) and will continue as recommended.    Personal Factors and Comorbidities Comorbidity 1    Comorbidities migraines    Examination-Activity Limitations Sleep;Bed Mobility;Stairs;Stand;Transfers;Locomotion Level    Examination-Participation Restrictions Church;Community  Activity;Yard Work;Occupation    Stability/Clinical Decision Making Stable/Uncomplicated    Rehab Potential Good    PT Frequency 1x / week    PT Duration 4 weeks    PT Treatment/Interventions ADLs/Self Care Home Management;Cryotherapy;Electrical Stimulation;Iontophoresis 82m/ml Dexamethasone;Moist Heat;Balance training;Therapeutic exercise;Therapeutic activities;Functional mobility training;Stair training;Gait training;Neuromuscular re-education;Patient/family education;Manual techniques;Taping;Dry needling    PT Next Visit Plan progress strengthening exercises as able, manual/modalities PRN, assess response to DN; see what MD says    PT Home Exercise Plan Access Code: [email or web address]    Consulted and Agree with Plan of Care Patient           Patient will benefit from skilled therapeutic intervention in order to improve the following deficits and impairments:  Increased fascial restricitons,Increased muscle spasms,Pain,Decreased mobility,Decreased strength,Impaired flexibility  Visit Diagnosis: Pain in right hip - Plan: PT plan of care cert/re-cert  Muscle weakness (generalized) - Plan: PT plan of care cert/re-cert  Other symptoms and signs involving the musculoskeletal system - Plan: PT plan of care cert/re-cert  Chronic bilateral low back pain without sciatica - Plan: PT plan of care cert/re-cert     Problem List Patient Active Problem List   Diagnosis Date Noted  . Cervical radiculitis 08/24/2018  . Neck pain 08/24/2018  . Axillary lump, right 08/21/2018  . Elevated LDL cholesterol level 07/07/2018  . Gastroesophageal reflux disease without esophagitis 07/06/2018  . Chronic midline low back pain without sciatica 09/09/2016  . Lipoma of upper extremity 09/09/2016  . Vitamin D deficiency 09/11/2015  . Overweight 09/11/2015  . Migraine without aura and with status migrainosus, not intractable 09/08/2015  . Gestational diabetes mellitus 07/11/2014  . Gestational hypertension  07/11/2014  . Obese 07/11/2014      SLaureen Abrahams PT, DPT 12/18/20 1:11 PM   PHYSICAL THERAPY DISCHARGE SUMMARY  Visits from Start of Care: 3  Current functional level related to goals / functional outcomes: See note   Remaining deficits: See note   Education / Equipment: HEP Plan: Patient agrees to discharge.  Patient goals were partially met. Patient is being discharged due to not returning since the  last visit.  ?????    Scot Jun, PT, DPT, OCS, ATC 01/23/21  2:04 PM        [facility] [address] [place], [place], [address] Phone: [phone]   Fax:  [phone]  Name: ALYSSAMAE KLINCK MRN: [id number] Date of Birth: [birth year]

## 2020-12-27 ENCOUNTER — Ambulatory Visit: Payer: BC Managed Care – PPO | Admitting: Orthopaedic Surgery

## 2021-01-31 ENCOUNTER — Encounter: Payer: Self-pay | Admitting: Orthopaedic Surgery

## 2021-01-31 ENCOUNTER — Other Ambulatory Visit: Payer: Self-pay

## 2021-01-31 ENCOUNTER — Ambulatory Visit: Payer: BC Managed Care – PPO | Admitting: Orthopaedic Surgery

## 2021-01-31 DIAGNOSIS — M7061 Trochanteric bursitis, right hip: Secondary | ICD-10-CM

## 2021-01-31 MED ORDER — LIDOCAINE HCL 1 % IJ SOLN
3.0000 mL | INTRAMUSCULAR | Status: AC | PRN
  Administered 2021-01-31: 3 mL

## 2021-01-31 MED ORDER — METHYLPREDNISOLONE ACETATE 40 MG/ML IJ SUSP
40.0000 mg | INTRAMUSCULAR | Status: AC | PRN
  Administered 2021-01-31: 40 mg via INTRA_ARTICULAR

## 2021-01-31 NOTE — Progress Notes (Signed)
   Procedure Note  Patient: Emily Irwin             Date of Birth: June 11, 1976           MRN: [id number]             Visit Date: 01/31/2021 HPI: Mrs. Hiott is a 45 year old female comes in today for right hip pain.  She was last seen 10/16/2020 for right hip trochanteric bursitis.  The injection helped.  She got relief for approximately 3 months.  She is been going to physical therapy and feels like this helped but she has made all the progress she feels that she can make a therapy.  No numbness tingling down the right leg.  She describes the pain as throbbing pain worse whenever she lies on the right hip at night.  Physical exam: Right hip no erythema.  Rashes over the right lateral hip.  Tenderness over the right trochanteric region.  Ambulates without any assistive device.   Procedures: Visit Diagnoses:  1. Trochanteric bursitis, right hip     Large Joint Inj: R greater trochanter on 01/31/2021 9:58 AM Indications: pain Details: 22 G 1.5 in needle, lateral approach  Arthrogram: No  Medications: 3 mL lidocaine 1 %; 40 mg methylPREDNISolone acetate 40 MG/ML Outcome: tolerated well, no immediate complications Procedure, treatment alternatives, risks and benefits explained, specific risks discussed. Consent was given by the patient. Immediately prior to procedure a time out was called to verify the correct patient, procedure, equipment, support staff and site/side marked as required. Patient was prepped and draped in the usual sterile fashion.    Plan: She will continue home exercise program as taught by therapy.  Follow-up with [place] as needed.  Questions encouraged and answered.  She knows to wait at least 3 months between injections.

## 2021-03-19 DIAGNOSIS — H11431 Conjunctival hyperemia, right eye: Secondary | ICD-10-CM | POA: Diagnosis not present

## 2021-05-16 ENCOUNTER — Other Ambulatory Visit: Payer: Self-pay | Admitting: Physician Assistant

## 2021-05-16 DIAGNOSIS — Z1231 Encounter for screening mammogram for malignant neoplasm of breast: Secondary | ICD-10-CM

## 2021-06-21 ENCOUNTER — Ambulatory Visit
Admission: RE | Admit: 2021-06-21 | Discharge: 2021-06-21 | Disposition: A | Discharge status: Home / Self Care | Payer: BC Managed Care – PPO | Source: Ambulatory Visit | Attending: Physician Assistant | Admitting: Physician Assistant

## 2021-06-21 ENCOUNTER — Other Ambulatory Visit: Payer: Self-pay

## 2021-06-21 DIAGNOSIS — Z1231 Encounter for screening mammogram for malignant neoplasm of breast: Secondary | ICD-10-CM

## 2021-06-22 NOTE — Progress Notes (Signed)
Normal mammogram. Follow up in 1 year.

## 2021-08-15 ENCOUNTER — Other Ambulatory Visit: Payer: Self-pay

## 2021-08-15 ENCOUNTER — Ambulatory Visit
Admission: EM | Admit: 2021-08-15 | Discharge: 2021-08-15 | Disposition: A | Discharge status: Home / Self Care | Payer: BC Managed Care – PPO | Attending: Physician Assistant | Admitting: Physician Assistant

## 2021-08-15 ENCOUNTER — Ambulatory Visit: Admit: 2021-08-15 | Disposition: A | Discharge status: Home / Self Care | Payer: Self-pay | Source: Home / Self Care

## 2021-08-15 DIAGNOSIS — J069 Acute upper respiratory infection, unspecified: Secondary | ICD-10-CM

## 2021-08-15 MED ORDER — OSELTAMIVIR PHOSPHATE 75 MG PO CAPS: 75.0000 mg | ORAL_CAPSULE | Freq: Two times a day (BID) | ORAL | 0 refills | Status: DC

## 2021-08-15 MED ORDER — ACETAMINOPHEN 500 MG PO TABS
500.0000 mg | ORAL_TABLET | Freq: Once | ORAL | Status: AC
  Administered 2021-08-15: 18:00:00 500 mg via ORAL

## 2021-08-15 NOTE — ED Provider Notes (Signed)
[name]    CSN: [id number] Arrival date & time: 08/15/21  1614      History   Chief Complaint Chief Complaint  Patient presents with   Generalized Body Aches   Fever    HPI Emily Irwin is a 45 y.o. female.   Patient here today for evaluation of left ear pain, cough and headache that started 2 days ago.  She states that she has had some chills and body aches today.  She has not had sore throat.  She did take BC powder earlier today for headache.  She is also tried Robitussin and Zofran with mild relief.  She has had some nausea but no vomiting or diarrhea.  The history is provided by the patient.  Fever Associated symptoms: chills, congestion, cough, ear pain, myalgias and nausea   Associated symptoms: no diarrhea, no sore throat and no vomiting    Past Medical History:  Diagnosis Date   Anemia 2007   hemorrhaged during delivery   Diabetes in pregnancy    DIET CONTROLLED   Headache(784.0)    migraines   Hypertension in pregnancy 2007    Patient Active Problem List   Diagnosis Date Noted   Cervical radiculitis 08/24/2018   Neck pain 08/24/2018   Axillary lump, right 08/21/2018   Elevated LDL cholesterol level 07/07/2018   Gastroesophageal reflux disease without esophagitis 07/06/2018   Chronic midline low back pain without sciatica 09/09/2016   Lipoma of upper extremity 09/09/2016   Vitamin D deficiency 09/11/2015   Overweight 09/11/2015   Migraine without aura and with status migrainosus, not intractable 09/08/2015   Gestational diabetes mellitus 07/11/2014   Gestational hypertension 07/11/2014   Obese 07/11/2014    Past Surgical History:  Procedure Laterality Date   KNEE ARTHROSCOPY  2007    OB History     Gravida  2   Para  2   Term  1   Preterm      AB      Living  2      SAB      IAB      Ectopic      Multiple      Live Births  1            Home Medications    Prior to Admission medications    Medication Sig Start Date End Date Taking? Authorizing Provider  oseltamivir (TAMIFLU) 75 MG capsule Take 1 capsule (75 mg total) by mouth every 12 (twelve) hours. 08/15/21  Yes Francene Finders, PA-C  cholecalciferol (VITAMIN D3) 25 MCG (1000 UT) tablet Take 1,000 Units by mouth daily.    [provider]  cyclobenzaprine (FLEXERIL) 10 MG tablet Take 1 tablet (10 mg total) by mouth 3 (three) times daily as needed for muscle spasms. Patient not taking: Reported on 11/02/2020 08/21/18   Iran Planas L, PA-C  ondansetron (ZOFRAN) 4 MG tablet Take 1 tablet (4 mg total) by mouth every 8 (eight) hours as needed for nausea or vomiting. 07/14/19   Donella Stade, PA-C    Family History Family History  Problem Relation Age of Onset   Diabetes Maternal Grandmother    Congestive Heart Failure Maternal Grandmother    Diabetes Paternal Grandfather    Diabetes Mother    Hypertension Father    Alcohol abuse Maternal Uncle    Alcohol abuse Maternal Grandfather    Congestive Heart Failure Maternal Grandfather     Social History Social History   Tobacco  Use   Smoking status: Never   Smokeless tobacco: Never  Substance Use Topics   Alcohol use: Yes   Drug use: No     Allergies   Patient has no known allergies.   Review of Systems Review of Systems  Constitutional:  Positive for chills and fever.  HENT:  Positive for congestion and ear pain. Negative for sore throat.   Eyes:  Negative for discharge and redness.  Respiratory:  Positive for cough. Negative for shortness of breath and wheezing.   Gastrointestinal:  Positive for nausea. Negative for abdominal pain, diarrhea and vomiting.  Musculoskeletal:  Positive for myalgias.    Physical Exam Triage Vital Signs ED Triage Vitals  Enc Vitals Group     BP 08/15/21 1739 139/82     Pulse Rate 08/15/21 1739 94     Resp 08/15/21 1739 18     Temp 08/15/21 1739 (!) 101.6 F (38.7 C)     Temp Source 08/15/21 1739 Oral     SpO2  08/15/21 1739 97 %     Weight --      Height --      Head Circumference --      Peak Flow --      Pain Score 08/15/21 1742 4     Pain Loc --      Pain Edu? --      Excl. in [place]? --    No data found.  Updated Vital Signs BP 139/82 (BP Location: Left Arm)    Pulse 94    Temp 99.8 F (37.7 C) Comment: rechecked at discharge   Resp 18    SpO2 97%    Breastfeeding No      Physical Exam Vitals and nursing note reviewed.  Constitutional:      General: She is not in acute distress.    Appearance: Normal appearance. She is not ill-appearing.  HENT:     Head: Normocephalic and atraumatic.     Right Ear: Tympanic membrane normal.     Left Ear: Tympanic membrane normal.     Nose: Congestion present.     Mouth/Throat:     Mouth: Mucous membranes are moist.     Pharynx: No oropharyngeal exudate or posterior oropharyngeal erythema.  Eyes:     Conjunctiva/sclera: Conjunctivae normal.  Cardiovascular:     Rate and Rhythm: Normal rate and regular rhythm.     Heart sounds: Normal heart sounds. No murmur heard. Pulmonary:     Effort: Pulmonary effort is normal. No respiratory distress.     Breath sounds: Normal breath sounds. No wheezing, rhonchi or rales.  Skin:    General: Skin is warm and dry.  Neurological:     Mental Status: She is alert.  Psychiatric:        Mood and Affect: Mood normal.        Thought Content: Thought content normal.     UC Treatments / Results  Labs (all labs ordered are listed, but only abnormal results are displayed) Labs Reviewed  COVID-19, FLU A+B NAA    EKG   Radiology No results found.  Procedures Procedures (including critical care time)  Medications Ordered in UC Medications  acetaminophen (TYLENOL) tablet 500 mg (500 mg Oral Given 08/15/21 1800)    Initial Impression / Assessment and Plan / UC Course  I have reviewed the triage vital signs and the nursing notes.  Pertinent labs & imaging results that were available during my care of  the patient were  reviewed by me and considered in my medical decision making (see chart for details).    COVID and flu screening ordered.  Will treat with Tamiflu given symptoms do seem to be flulike in nature.  Encouraged follow-up with any further concerns.  Final Clinical Impressions(s) / UC Diagnoses   Final diagnoses:  Acute upper respiratory infection   Discharge Instructions   None    ED Prescriptions     Medication Sig Dispense Auth. Provider   oseltamivir (TAMIFLU) 75 MG capsule Take 1 capsule (75 mg total) by mouth every 12 (twelve) hours. 10 capsule Francene Finders, PA-C      PDMP not reviewed this encounter.   Francene Finders, PA-C 08/15/21 1936

## 2021-08-15 NOTE — ED Triage Notes (Signed)
Two day h/o left ear pain, cough and onset today of HA, chills and body aches. Denies sore throat, but notes itchiness. BC powder taken at 4:30p today for HA. Also, has been taking robitussin and zofran with some relief. Confirms nausea, but denies v/d.

## 2021-08-16 LAB — COVID-19, FLU A+B NAA
Influenza A, NAA: NOT DETECTED
Influenza B, NAA: NOT DETECTED
SARS-CoV-2, NAA: NOT DETECTED

## 2021-12-17 DIAGNOSIS — M21861 Other specified acquired deformities of right lower leg: Secondary | ICD-10-CM | POA: Diagnosis not present

## 2021-12-17 DIAGNOSIS — M722 Plantar fascial fibromatosis: Secondary | ICD-10-CM | POA: Diagnosis not present

## 2021-12-18 DIAGNOSIS — Z01419 Encounter for gynecological examination (general) (routine) without abnormal findings: Secondary | ICD-10-CM | POA: Diagnosis not present

## 2021-12-18 DIAGNOSIS — I10 Essential (primary) hypertension: Secondary | ICD-10-CM | POA: Diagnosis not present

## 2021-12-18 DIAGNOSIS — Z8632 Personal history of gestational diabetes: Secondary | ICD-10-CM | POA: Diagnosis not present

## 2021-12-18 DIAGNOSIS — G43829 Menstrual migraine, not intractable, without status migrainosus: Secondary | ICD-10-CM | POA: Diagnosis not present

## 2022-01-16 DIAGNOSIS — M722 Plantar fascial fibromatosis: Secondary | ICD-10-CM | POA: Diagnosis not present

## 2022-04-16 ENCOUNTER — Telehealth: Payer: Self-pay

## 2022-04-16 ENCOUNTER — Telehealth: Payer: BC Managed Care – PPO | Admitting: Physician Assistant

## 2022-04-16 DIAGNOSIS — U071 COVID-19: Secondary | ICD-10-CM | POA: Diagnosis not present

## 2022-04-16 NOTE — Progress Notes (Signed)
Virtual Visit Consent   AMIAH FROHLICH, you are scheduled for a virtual visit with a [facility] provider today. Just as with appointments in the office, your consent must be obtained to participate. Your consent will be active for this visit and any virtual visit you may have with one of our providers in the next 365 days. If you have a MyChart account, a copy of this consent can be sent to you electronically.  As this is a virtual visit, video technology does not allow for your provider to perform a traditional examination. This may limit your provider's ability to fully assess your condition. If your provider identifies any concerns that need to be evaluated in person or the need to arrange testing (such as labs, EKG, etc.), we will make arrangements to do so. Although advances in technology are sophisticated, we cannot ensure that it will always work on either your end or our end. If the connection with a video visit is poor, the visit may have to be switched to a telephone visit. With either a video or telephone visit, we are not always able to ensure that we have a secure connection.  By engaging in this virtual visit, you consent to the provision of healthcare and authorize for your insurance to be billed (if applicable) for the services provided during this visit. Depending on your insurance coverage, you may receive a charge related to this service.  I need to obtain your verbal consent now. Are you willing to proceed with your visit today? Emily Irwin has provided verbal consent on 04/16/2022 for a virtual visit (video or telephone). [name], [place]  Date: 04/16/2022 11:57 AM  Virtual Visit via Video Note   I, [name], connected with  Emily Irwin  ([id number], 1976-01-04) on 04/16/22 at 11:45 AM EDT by a video-enabled telemedicine application and verified that I am speaking with the correct person using two identifiers.  Location: Patient:  Virtual Visit Location Patient: Home Provider: Virtual Visit Location Provider: Home Office   I discussed the limitations of evaluation and management by telemedicine and the availability of in person appointments. The patient expressed understanding and agreed to proceed.    History of Present Illness: Emily Irwin is a 46 y.o. who identifies as a female who was assigned female at birth, and is being seen today for COVID-19. Notes husband returned from a business trip with COVID, testing positive last week. For her, she notes symptoms starting Saturday with a scratchy throat which has progressed. Now with fever, chills, fatigue, sweats. Advil controls fever and chills. This morning, testing positive for COVID-19. Denies chest pain or SOB. Cough is present but manageable.   HPI: HPI  Problems:  Patient Active Problem List   Diagnosis Date Noted   Cervical radiculitis 08/24/2018   Neck pain 08/24/2018   Axillary lump, right 08/21/2018   Elevated LDL cholesterol level 07/07/2018   Gastroesophageal reflux disease without esophagitis 07/06/2018   Chronic midline low back pain without sciatica 09/09/2016   Lipoma of upper extremity 09/09/2016   Vitamin D deficiency 09/11/2015   Overweight 09/11/2015   Migraine without aura and with status migrainosus, not intractable 09/08/2015   Gestational diabetes mellitus 07/11/2014   Gestational hypertension 07/11/2014   Obese 07/11/2014    Allergies: No Known Allergies Medications: No current outpatient medications on file.  Observations/Objective: Patient is well-developed, well-nourished in no acute distress.  Resting comfortably at home.  Head is normocephalic, atraumatic.  No labored  breathing. Speech is clear and coherent with logical content.  Patient is alert and oriented at baseline.   Assessment and Plan: 1. COVID-19  Discussed risks/benefits of antiviral medications including most common potential ADRs. Patient voiced  understanding and would like to proceed with antiviral medication. They are candidate for molnupiravir. Rx sent to pharmacy. Supportive measures, OTC medications and vitamin regimen reviewed. Patient has been enrolled in a MyChart COVID symptom monitoring program. Samule Dry reviewed in detail. Strict ER precautions discussed with patient.    Follow Up Instructions: I discussed the assessment and treatment plan with the patient. The patient was provided an opportunity to ask questions and all were answered. The patient agreed with the plan and demonstrated an understanding of the instructions.  A copy of instructions were sent to the patient via MyChart unless otherwise noted below.   The patient was advised to call back or seek an in-person evaluation if the symptoms worsen or if the condition fails to improve as anticipated.  Time:  I spent 10 minutes with the patient via telehealth technology discussing the above problems/concerns.    [name], PA-C

## 2022-04-16 NOTE — Patient Instructions (Addendum)
Emily Irwin, thank you for joining [name], PA-C for today's virtual visit.  While this provider is not your primary care provider (PCP), if your PCP is located in our provider database this encounter information will be shared with them immediately following your visit.  Consent: (Patient) Emily Irwin provided verbal consent for this virtual visit at the beginning of the encounter.  Current Medications:  Current Outpatient Medications:    cholecalciferol (VITAMIN D3) 25 MCG (1000 UT) tablet, Take 1,000 Units by mouth daily., Disp: , Rfl:    cyclobenzaprine (FLEXERIL) 10 MG tablet, Take 1 tablet (10 mg total) by mouth 3 (three) times daily as needed for muscle spasms. (Patient not taking: Reported on 11/02/2020), Disp: 30 tablet, Rfl: 0   ondansetron (ZOFRAN) 4 MG tablet, Take 1 tablet (4 mg total) by mouth every 8 (eight) hours as needed for nausea or vomiting., Disp: 20 tablet, Rfl: 3   oseltamivir (TAMIFLU) 75 MG capsule, Take 1 capsule (75 mg total) by mouth every 12 (twelve) hours., Disp: 10 capsule, Rfl: 0   Medications ordered in this encounter:  No orders of the defined types were placed in this encounter.   *If you need refills on other medications prior to your next appointment, please contact your pharmacy*  Follow-Up: Call back or seek an in-person evaluation if the symptoms worsen or if the condition fails to improve as anticipated.  Other Instructions Please keep well-hydrated and get plenty of rest. Start a saline nasal rinse to flush out your nasal passages. You can use plain Mucinex to help thin congestion. If you have a humidifier, running in the bedroom at night. I want you to start OTC vitamin D3 1000 units daily, vitamin C 1000 mg daily, and a zinc supplement. Please take prescribed medications as directed.  You have been enrolled in a MyChart symptom monitoring program. Please answer these questions daily so we can keep track of how  you are doing.  You were to quarantine for 5 days from onset of your symptoms.  After day 5, if you have had no fever and you are feeling better, you can end quarantine but need to mask for an additional 5 days. After day 5 if you have a fever or are having significant symptoms, please quarantine for full 10 days.  If you note any worsening of symptoms, any significant shortness of breath or any chest pain, please seek ER evaluation ASAP.  Please do not delay care!  COVID-19: What to Do if You Are Sick If you test positive and are an older adult or someone who is at high risk of getting very sick from COVID-19, treatment may be available. Contact a healthcare provider right away after a positive test to determine if you are eligible, even if your symptoms are mild right now. You can also visit a Test to Treat location and, if eligible, receive a prescription from a provider. Don't delay: Treatment must be started within the first few days to be effective. If you have a fever, cough, or other symptoms, you might have COVID-19. Most people have mild illness and are able to recover at home. If you are sick: Keep track of your symptoms. If you have an emergency warning sign (including trouble breathing), call 911. Steps to help prevent the spread of COVID-19 if you are sick If you are sick with COVID-19 or think you might have COVID-19, follow the steps below to care for yourself and to help protect other people  in your home and community. Stay home except to get medical care Stay home. Most people with COVID-19 have mild illness and can recover at home without medical care. Do not leave your home, except to get medical care. Do not visit public areas and do not go to places where you are unable to wear a mask. Take care of yourself. Get rest and stay hydrated. Take over-the-counter medicines, such as acetaminophen, to help you feel better. Stay in touch with your doctor. Call before you get medical  care. Be sure to get care if you have trouble breathing, or have any other emergency warning signs, or if you think it is an emergency. Avoid public transportation, ride-sharing, or taxis if possible. Get tested If you have symptoms of COVID-19, get tested. While waiting for test results, stay away from others, including staying apart from those living in your household. Get tested as soon as possible after your symptoms start. Treatments may be available for people with COVID-19 who are at risk for becoming very sick. Don't delay: Treatment must be started early to be effective--some treatments must begin within 5 days of your first symptoms. Contact your healthcare provider right away if your test result is positive to determine if you are eligible. Self-tests are one of several options for testing for the virus that causes COVID-19 and may be more convenient than laboratory-based tests and point-of-care tests. Ask your healthcare provider or your local health department if you need help interpreting your test results. You can visit your state, tribal, local, and territorial health department's website to look for the latest local information on testing sites. Separate yourself from other people As much as possible, stay in a specific room and away from other people and pets in your home. If possible, you should use a separate bathroom. If you need to be around other people or animals in or outside of the home, wear a well-fitting mask. Tell your close contacts that they may have been exposed to COVID-19. An infected person can spread COVID-19 starting 48 hours (or 2 days) before the person has any symptoms or tests positive. By letting your close contacts know they may have been exposed to COVID-19, you are helping to protect everyone. See COVID-19 and Animals if you have questions about pets. If you are diagnosed with COVID-19, someone from the health department may call you. Answer the call to slow  the spread. Monitor your symptoms Symptoms of COVID-19 include fever, cough, or other symptoms. Follow care instructions from your healthcare provider and local health department. Your local health authorities may give instructions on checking your symptoms and reporting information. When to seek emergency medical attention Look for emergency warning signs* for COVID-19. If someone is showing any of these signs, seek emergency medical care immediately: Trouble breathing Persistent pain or pressure in the chest New confusion Inability to wake or stay awake Pale, gray, or blue-colored skin, lips, or nail beds, depending on skin tone *This list is not all possible symptoms. Please call your medical provider for any other symptoms that are severe or concerning to you. Call 911 or call ahead to your local emergency facility: Notify the operator that you are seeking care for someone who has or may have COVID-19. Call ahead before visiting your doctor Call ahead. Many medical visits for routine care are being postponed or done by phone or telemedicine. If you have a medical appointment that cannot be postponed, call your doctor's office, and tell them you have  or may have COVID-19. This will help the office protect themselves and other patients. If you are sick, wear a well-fitting mask You should wear a mask if you must be around other people or animals, including pets (even at home). Wear a mask with the best fit, protection, and comfort for you. You don't need to wear the mask if you are alone. If you can't put on a mask (because of trouble breathing, for example), cover your coughs and sneezes in some other way. Try to stay at least 6 feet away from other people. This will help protect the people around you. Masks should not be placed on young children under age 67 years, anyone who has trouble breathing, or anyone who is not able to remove the mask without help. Cover your coughs and sneezes Cover  your mouth and nose with a tissue when you cough or sneeze. Throw away used tissues in a lined trash can. Immediately wash your hands with soap and water for at least 20 seconds. If soap and water are not available, clean your hands with an alcohol-based hand sanitizer that contains at least 60% alcohol. Clean your hands often Wash your hands often with soap and water for at least 20 seconds. This is especially important after blowing your nose, coughing, or sneezing; going to the bathroom; and before eating or preparing food. Use hand sanitizer if soap and water are not available. Use an alcohol-based hand sanitizer with at least 60% alcohol, covering all surfaces of your hands and rubbing them together until they feel dry. Soap and water are the best option, especially if hands are visibly dirty. Avoid touching your eyes, nose, and mouth with unwashed hands. Handwashing Tips Avoid sharing personal household items Do not share dishes, drinking glasses, cups, eating utensils, towels, or bedding with other people in your home. Wash these items thoroughly after using them with soap and water or put in the dishwasher. Clean surfaces in your home regularly Clean and disinfect high-touch surfaces (for example, doorknobs, tables, handles, light switches, and countertops) in your "sick room" and bathroom. In shared spaces, you should clean and disinfect surfaces and items after each use by the person who is ill. If you are sick and cannot clean, a caregiver or other person should only clean and disinfect the area around you (such as your bedroom and bathroom) on an as needed basis. Your caregiver/other person should wait as long as possible (at least several hours) and wear a mask before entering, cleaning, and disinfecting shared spaces that you use. Clean and disinfect areas that may have blood, stool, or body fluids on them. Use household cleaners and disinfectants. Clean visible dirty surfaces with  household cleaners containing soap or detergent. Then, use a household disinfectant. Use a product from [employer] List N: Disinfectants for Coronavirus (JJHER-74). Be sure to follow the instructions on the label to ensure safe and effective use of the product. Many products recommend keeping the surface wet with a disinfectant for a certain period of time (look at "contact time" on the product label). You may also need to wear personal protective equipment, such as gloves, depending on the directions on the product label. Immediately after disinfecting, wash your hands with soap and water for 20 seconds. For completed guidance on cleaning and disinfecting your home, visit Complete Disinfection Guidance. Take steps to improve ventilation at home Improve ventilation (air flow) at home to help prevent from spreading COVID-19 to other people in your household. Clear out COVID-19  virus particles in the air by opening windows, using air filters, and turning on fans in your home. Use this interactive tool to learn how to improve air flow in your home. When you can be around others after being sick with COVID-19 Deciding when you can be around others is different for different situations. Find out when you can safely end home isolation. For any additional questions about your care, contact your healthcare provider or state or local health department. 11/07/2020 Content source: [facility] ([facility]), Division of Viral Diseases This information is not intended to replace advice given to you by your health care provider. Make sure you discuss any questions you have with your health care provider. Document Revised: 12/21/2020 Document Reviewed: 12/21/2020 Elsevier Patient Education  2022 [employer].      If you have been instructed to have an in-person evaluation today at a local Urgent Care facility, please use the link below. It will take you to a list of all  of our available [facility], including address, phone number and hours of operation. Please do not delay care.  [facility]  If you or a family member do not have a primary care provider, use the link below to schedule a visit and establish care. When you choose a [facility] primary care physician or advanced practice provider, you gain a long-term partner in health. Find a Primary Care Provider  Learn more about [facility] in-office and virtual care options: [facility]

## 2022-04-16 NOTE — Telephone Encounter (Signed)
Called, spoke with patient about temperature symptoms. Verbal understanding.

## 2022-04-17 ENCOUNTER — Encounter: Payer: Self-pay | Admitting: Physician Assistant

## 2022-04-19 MED ORDER — MOLNUPIRAVIR EUA 200MG CAPSULE: 4.0000 | ORAL_CAPSULE | Freq: Two times a day (BID) | ORAL | 0 refills | Status: AC

## 2022-07-17 DIAGNOSIS — M722 Plantar fascial fibromatosis: Secondary | ICD-10-CM | POA: Diagnosis not present

## 2023-03-13 IMAGING — MG MM DIGITAL SCREENING BILAT W/ TOMO AND CAD
8 series · 8 of 24 positions shown · non-contrast
Comparison: Previous exam(s).

CLINICAL DATA: Screening.

EXAM:
DIGITAL SCREENING BILATERAL MAMMOGRAM WITH TOMOSYNTHESIS AND CAD
TECHNIQUE: Bilateral screening digital craniocaudal and mediolateral oblique
mammograms were obtained. Bilateral screening digital breast
tomosynthesis was performed. The images were evaluated with
computer-aided detection.

[R CC synth-2D]
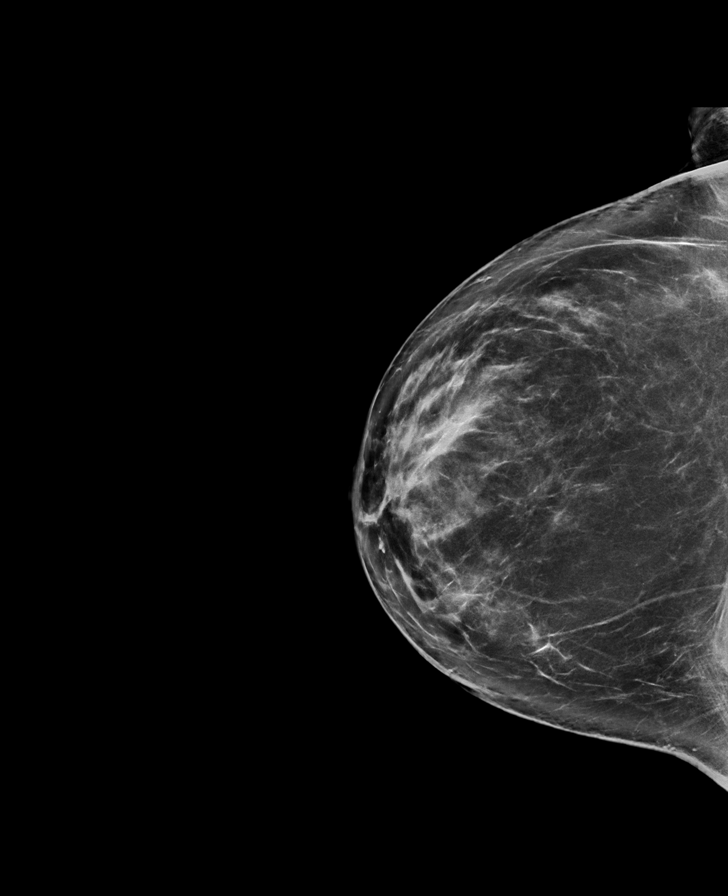

[R MLO synth-2D]
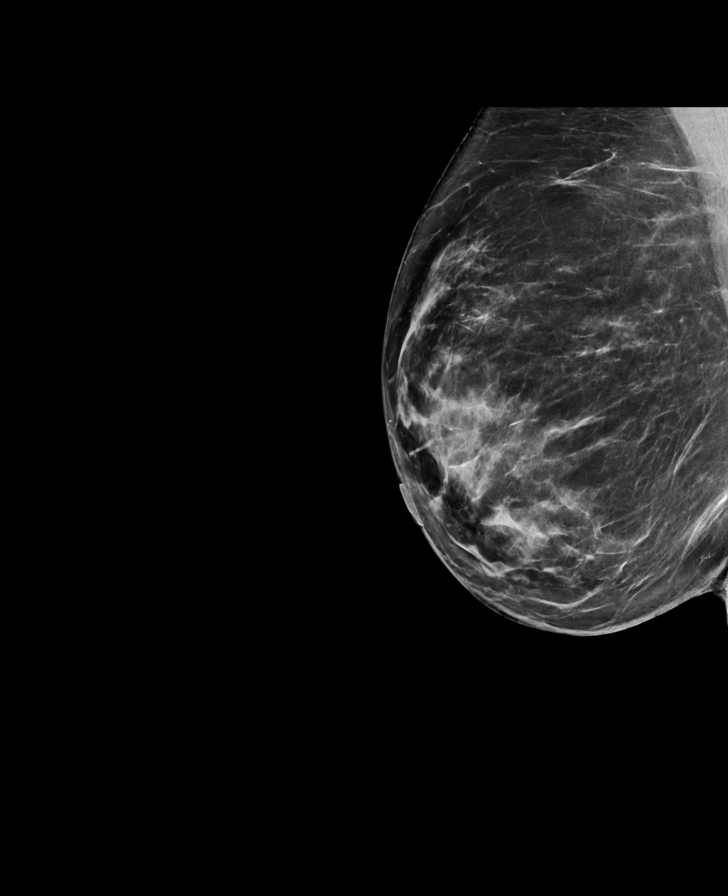

[L MLO synth-2D]
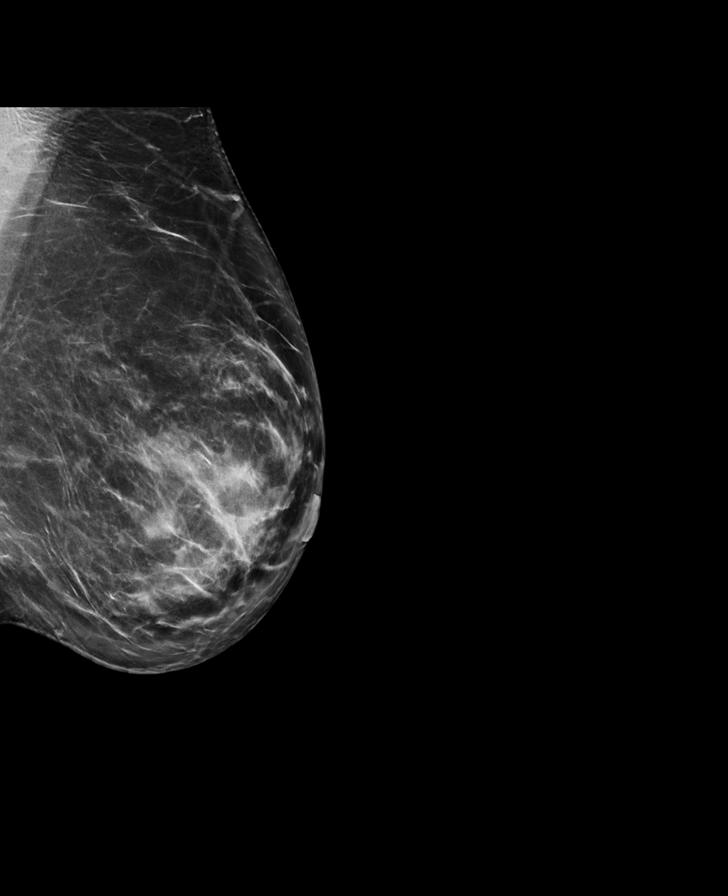

[L CC synth-2D]
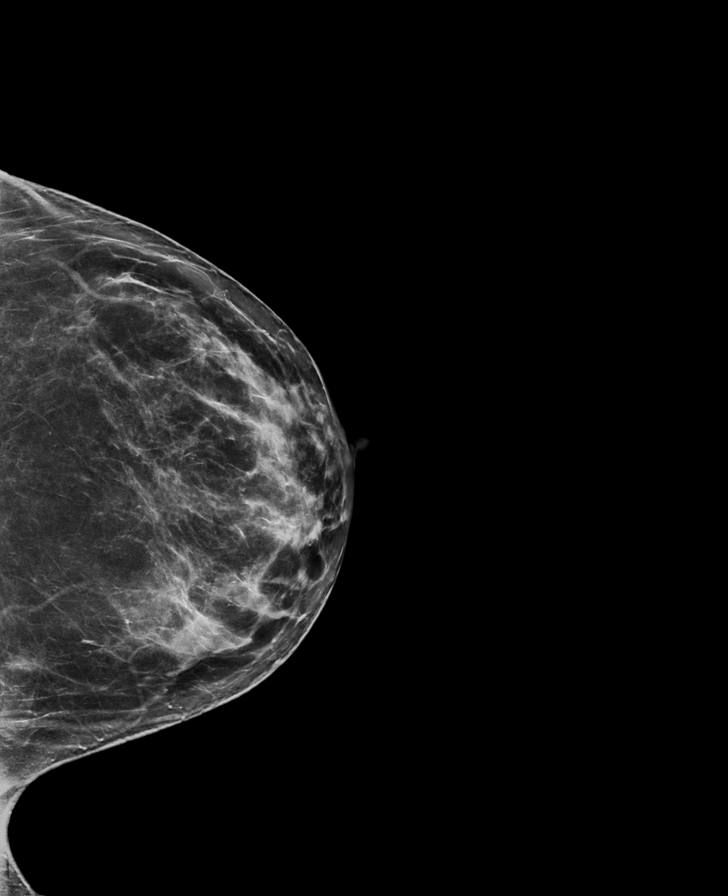

[L CC tomo · tomo slice 40/79.0]
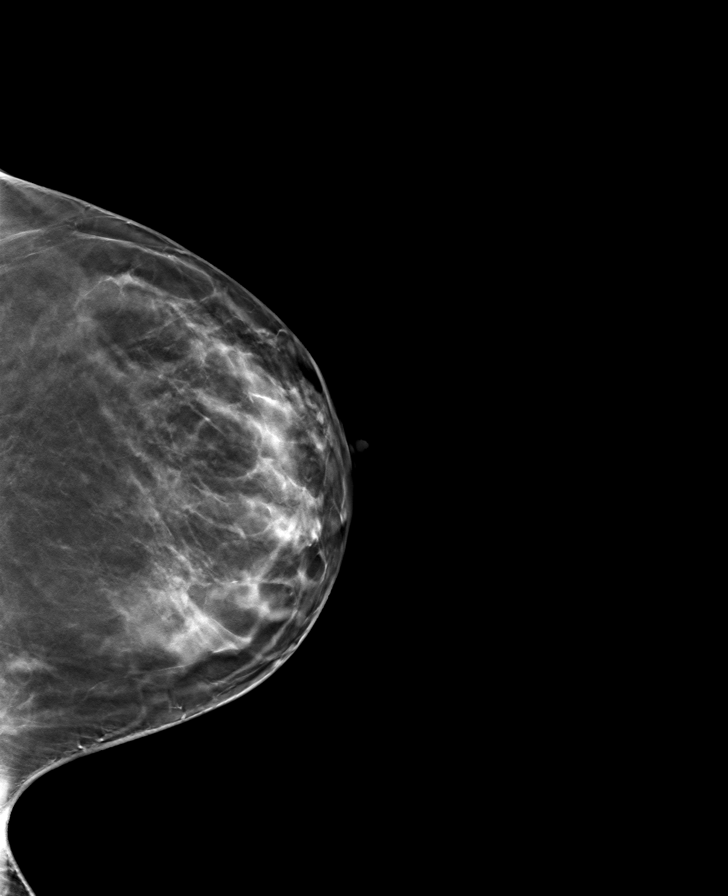

[R MLO tomo · tomo slice 47/92.0]
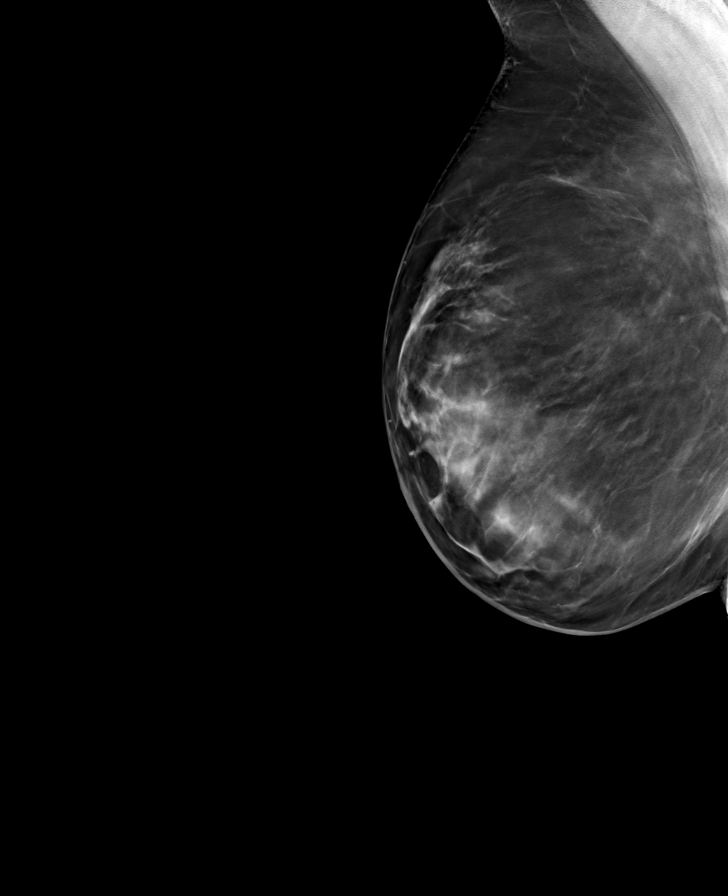

[L MLO tomo · tomo slice 47/92.0]
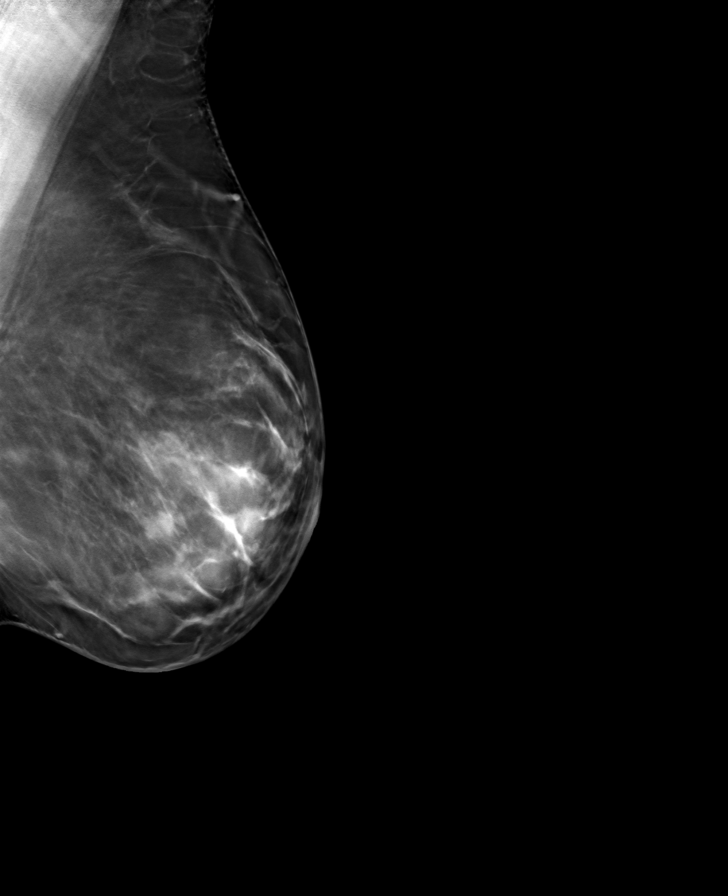

[R CC tomo · tomo slice 47/92.0]
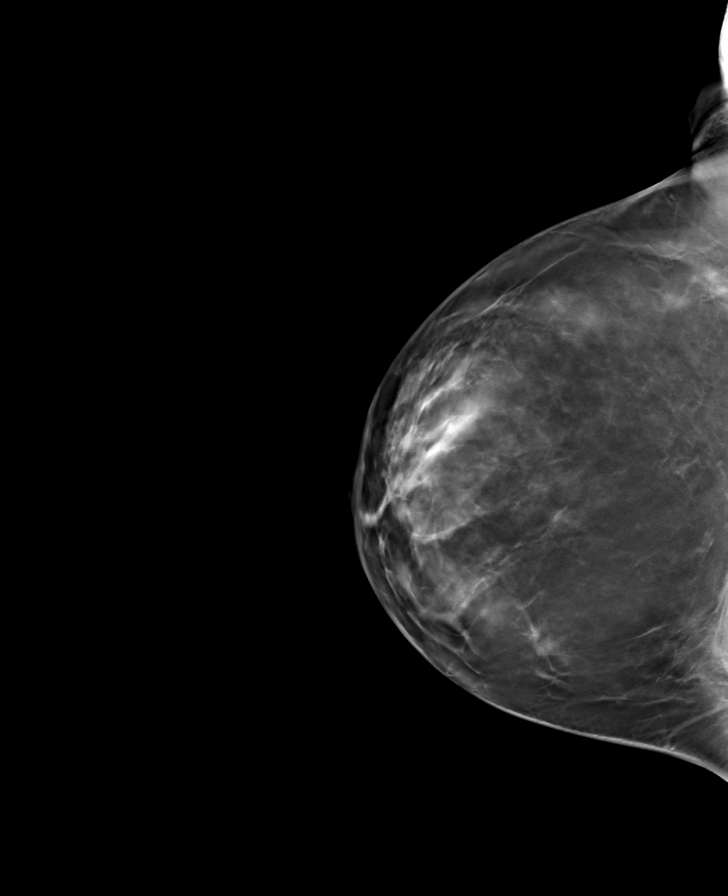

[8 of 24 positions shown; findings below may reference images not displayed]

ACR Breast Density Category c: The breast tissue is heterogeneously
dense, which may obscure small masses.
FINDINGS: There are no findings suspicious for malignancy.
IMPRESSION: No mammographic evidence of malignancy. A result letter of this
screening mammogram will be mailed directly to the patient.

RECOMMENDATION:
Screening mammogram in one year. (Code:[id number])

BI-RADS CATEGORY  1: Negative.

## 2023-11-10 ENCOUNTER — Ambulatory Visit (HOSPITAL_BASED_OUTPATIENT_CLINIC_OR_DEPARTMENT_OTHER)

## 2023-11-10 ENCOUNTER — Encounter (HOSPITAL_BASED_OUTPATIENT_CLINIC_OR_DEPARTMENT_OTHER): Payer: Self-pay | Admitting: Student

## 2023-11-10 ENCOUNTER — Ambulatory Visit (HOSPITAL_BASED_OUTPATIENT_CLINIC_OR_DEPARTMENT_OTHER): Admitting: Student

## 2023-11-10 DIAGNOSIS — M542 Cervicalgia: Secondary | ICD-10-CM | POA: Diagnosis not present

## 2023-11-10 DIAGNOSIS — M47812 Spondylosis without myelopathy or radiculopathy, cervical region: Secondary | ICD-10-CM | POA: Diagnosis not present

## 2023-11-10 DIAGNOSIS — M25511 Pain in right shoulder: Secondary | ICD-10-CM

## 2023-11-10 DIAGNOSIS — M5442 Lumbago with sciatica, left side: Secondary | ICD-10-CM | POA: Diagnosis not present

## 2023-11-10 DIAGNOSIS — G8929 Other chronic pain: Secondary | ICD-10-CM

## 2023-11-10 NOTE — Progress Notes (Signed)
 Chief Complaint: Right shoulder and low back pain     History of Present Illness:   Discussed the use of AI scribe software for clinical note transcription with the patient, who gave verbal consent to proceed.  Emily Irwin is a 48 year old female who presents with right shoulder pain and low back pain.  She has been experiencing persistent right shoulder pain that has worsened over the past few months. Initially, there was a decrease in range of motion with significant pain, exacerbated by activities involving resistance or certain ranges of motion, such as pulling wet clothes from the laundry or opening heavy doors. The pain is described as shooting and eases off when the activity stops, though some achiness remains. It is primarily located in the upper part of the shoulder and does not typically radiate past the elbow. She experiences occasional numbness in the fingers, excluding the thumb. Advil, heat, and ice help alleviate the pain but do not resolve it completely. Stretching improves her range of motion throughout the day. No decrease in strength is noted, with pain being the limiting factor in activities. Popping in the shoulder occurs during rotation but does not cause significant discomfort.  She also experiences chronic low back pain that is persistent and worsening in frequency. The pain sometimes radiates down the leg and is described as sharp nerve pain. She has not undergone specific physical therapy for the low back but continues to perform stretches learned during previous physical therapy for her hip. Muscle tightness and twisting sensations in the back are attributed to muscular issues. Her social history includes extensive computer work, which she believes may contribute to her symptoms. Long hours at the computer exacerbate her neck and shoulder discomfort.      Surgical History:   None  PMH/PSH/Family History/Social  History/Meds/Allergies:    Past Medical History:  Diagnosis Date   Anemia 2007   hemorrhaged during delivery   Diabetes in pregnancy    DIET CONTROLLED   Headache(784.0)    migraines   Hypertension in pregnancy 2007   Past Surgical History:  Procedure Laterality Date   KNEE ARTHROSCOPY  2007   Social History   Socioeconomic History   Marital status: Married    Spouse name: Not on file   Number of children: Not on file   Years of education: Not on file   Highest education level: Not on file  Occupational History   Not on file  Tobacco Use   Smoking status: Never   Smokeless tobacco: Never  Substance and Sexual Activity   Alcohol use: Yes   Drug use: No   Sexual activity: Yes  Other Topics Concern   Not on file  Social History Narrative   Not on file   Social Drivers of Health   Financial Resource Strain: Not on file  Food Insecurity: Not on file  Transportation Needs: Not on file  Physical Activity: Not on file  Stress: Not on file  Social Connections: Not on file   Family History  Problem Relation Age of Onset   Diabetes Maternal Grandmother    Congestive Heart Failure Maternal Grandmother    Diabetes Paternal Grandfather    Diabetes Mother    Hypertension Father    Alcohol abuse Maternal Uncle    Alcohol abuse Maternal Grandfather  Congestive Heart Failure Maternal Grandfather    No Known Allergies No current outpatient medications on file.   No current facility-administered medications for this visit.   No results found.  Review of Systems:   A ROS was performed including pertinent positives and negatives as documented in the HPI.  Physical Exam :   Constitutional: NAD and appears stated age Neurological: Alert and oriented Psych: Appropriate affect and cooperative There were no vitals taken for this visit.   Comprehensive Musculoskeletal Exam:    No tenderness with palpation throughout the cervical spine or paraspinal muscles.  Full  cervical range of motion in all planes without pain.  Tenderness in the right shoulder of the lateral deltoid and anterior glenohumeral joint.  Active shoulder range of motion to 160 degrees forward flexion, 40 degrees external rotation, and internal rotation to L4 bilaterally.  Positive Hawkins and painful arc test.  Negative empty can.  Grip strength 5/5 bilaterally.  Imaging:   Xray (cervical spine 4 views): Disc space narrowing noted at C5-C6.  Otherwise normal alignment and no acute abnormality.  Xray (right shoulder 3 views): Negative for bony abnormality   I personally reviewed and interpreted the radiographs.   Assessment and Plan:    Right shoulder pain   Chronic right shoulder pain with acute exacerbation radiates down the arm, with tenderness in the posterior shoulder and occasional numbness in fingers. Differential diagnosis includes shoulder impingement, bursitis, or cervical radiculopathy. Shoulder is well-appearing on x-ray. Administer a subacromial corticosteroid injection to assess symptom improvement and aid diagnosis. Continue anti-inflammatory medications as needed. Consider physical therapy if symptoms persist or the injection does not provide relief.  Finger numbness   Symptoms of numbness in fingers and arm pain suggest cervical radiculopathy. X-ray shows mild degenerative changes at C5-C6 with small bone spurs. May consider involvement of carpal tunnel syndrome. Recommend physical therapy for neck pain management. Consider further imaging, such as MRI, if symptoms persist or worsen.  Chronic low back pain   Persistent low back pain with episodes of worsening frequency and occasional left-sided sciatic involvement. Previous physical therapy was more targeted for the hip. Recommend physical therapy for low back pain management. Consider further intervention or MRI if physical therapy does not provide adequate relief.     I personally saw and evaluated the patient, and  participated in the management and treatment plan.  Hazle Nordmann, PA-C Orthopedics

## 2023-11-21 ENCOUNTER — Encounter (HOSPITAL_BASED_OUTPATIENT_CLINIC_OR_DEPARTMENT_OTHER): Payer: Self-pay

## 2023-11-21 DIAGNOSIS — G8929 Other chronic pain: Secondary | ICD-10-CM

## 2023-11-24 NOTE — Telephone Encounter (Signed)
PT order placed in chart 

## 2024-01-19 ENCOUNTER — Encounter: Payer: Self-pay | Admitting: Physical Therapy

## 2024-01-19 ENCOUNTER — Ambulatory Visit: Attending: Student | Admitting: Physical Therapy

## 2024-01-19 ENCOUNTER — Other Ambulatory Visit: Payer: Self-pay

## 2024-01-19 DIAGNOSIS — M6281 Muscle weakness (generalized): Secondary | ICD-10-CM | POA: Diagnosis not present

## 2024-01-19 DIAGNOSIS — G8929 Other chronic pain: Secondary | ICD-10-CM | POA: Diagnosis not present

## 2024-01-19 DIAGNOSIS — M25511 Pain in right shoulder: Secondary | ICD-10-CM | POA: Diagnosis not present

## 2024-01-19 DIAGNOSIS — R293 Abnormal posture: Secondary | ICD-10-CM | POA: Diagnosis not present

## 2024-01-19 NOTE — Therapy (Signed)
 OUTPATIENT PHYSICAL THERAPY UPPER EXTREMITY EVALUATION   Patient Name: Emily Irwin MRN: [id number] DOB:1975-12-25, 48 y.o., female Today's Date: 01/19/2024  END OF SESSION:  PT End of Session - 01/19/24 1406     Visit Number 1    Date for PT Re-Evaluation 03/15/24    Authorization Type [employer] (30 VL)    PT Start Time 1246    PT Stop Time 1323    PT Time Calculation (min) 37 min    Activity Tolerance Patient tolerated treatment well    Behavior During Therapy [facility] for tasks assessed/performed             Past Medical History:  Diagnosis Date   Anemia 2007   hemorrhaged during delivery   Diabetes in pregnancy    DIET CONTROLLED   Headache(784.0)    migraines   Hypertension in pregnancy 2007   Past Surgical History:  Procedure Laterality Date   KNEE ARTHROSCOPY  2007   Patient Active Problem List   Diagnosis Date Noted   Cervical radiculitis 08/24/2018   Neck pain 08/24/2018   Axillary lump, right 08/21/2018   Elevated LDL cholesterol level 07/07/2018   Gastroesophageal reflux disease without esophagitis 07/06/2018   Chronic midline low back pain without sciatica 09/09/2016   Lipoma of upper extremity 09/09/2016   Vitamin D  deficiency 09/11/2015   Overweight 09/11/2015   Migraine without aura and with status migrainosus, not intractable 09/08/2015   Gestational diabetes mellitus 07/11/2014   Gestational hypertension 07/11/2014   Obese 07/11/2014    PCP: Kita Perish   REFERRING PROVIDER: Amedeo Jupiter, PA-C  REFERRING DIAG:  Diagnosis  M25.511,G89.29 (ICD-10-CM) - Chronic right shoulder pain    THERAPY DIAG:  Chronic right shoulder pain  Muscle weakness (generalized)  Abnormal posture  Rationale for Evaluation and Treatment: Rehabilitation  ONSET DATE: 2 months ago  SUBJECTIVE:                                                                                                                                                                                       SUBJECTIVE STATEMENT: Patient presents with right shoulder pain that began a few months ago. She got a shoulder injection and she found some relief of symptoms (this was in March 2025), but now it has worn off. Prior to this she did Advil , ice, heat (Heat feels better for her). Her pain is waking up 2x-3x at night due to pain and after she readjusts she is able to fall back asleep. She is very limited in her ROM and her ability to hold against any resistance. Numbness and tingling is random she does not have it  daily. Limiting functional activities: reaching back wards; reaching in fridge/ cabinets; carrying groceries; catching a falling object. Hand dominance: Right  PERTINENT HISTORY: Hx neck pain; lipoma of upper extremity  PAIN:  Are you having pain? Yes: NPRS scale: 1(currently) 6 (worst)/10 Pain location: starts at joint and radiates down triceps and lateral deltoid  Pain description: soreness; sharp; shooting; radiates Aggravating factors: elevation; laying on it at night; full body stretch; reaching backwards while sitting in the car Relieving factors: Heat & Advil ; rest  PRECAUTIONS: None  RED FLAGS: None   WEIGHT BEARING RESTRICTIONS: No  FALLS:  Has patient fallen in last 6 months? No  LIVING ENVIRONMENT: Lives with: lives with their family Lives in: House/apartment Stairs: Yes   OCCUPATION: Accountant (constant computer work)  PLOF: Independent, Independent with basic ADLs, Independent with gait, and Independent with transfers  PATIENT GOALS: To stop the pain  NEXT MD VISIT: PRN  OBJECTIVE:  Note: Objective measures were completed at Evaluation unless otherwise noted.  DIAGNOSTIC FINDINGS:  Cervical Xray 11/19/2023 IMPRESSION: Mild degenerative joint changes at C5-6.  Right shoulder Xray 11/19/2023 IMPRESSION: Mild glenohumeral joint space narrowing.  FUNCTIONAL TESTS  :  5STS: 9.89 sec    PATIENT SURVEYS :  Quick  Dash 31.8/100 31.8%  COGNITION: Overall cognitive status: Within functional limits for tasks assessed     SENSATION: Occasional numbness and tingling that is infrequent  POSTURE: Rounded shoulder and forward head  UPPER EXTREMITY ROM: *uncomfortable & weak  Active ROM Right eval Left eval  Shoulder flexion 145* WFL  Shoulder extension    Shoulder abduction 150* WFL  Shoulder adduction    Shoulder internal rotation T12 T5  Shoulder external rotation C5* T2  Elbow flexion    Elbow extension    Wrist flexion    Wrist extension    Wrist ulnar deviation    Wrist radial deviation    Wrist pronation    Wrist supination    (Blank rows = not tested)  UPPER EXTREMITY MMT: 5/5 on left; 4 to 4+ on right no pain    SHOULDER SPECIAL TESTS: Impingement tests: Hawkins/Kennedy impingement test: positive  and Painful arc test: positive  Rotator cuff assessment: Empty can test: negative   PALPATION:  Increased muscle spasms right upper trap                                                                                                                             TREATMENT DATE:  01/19/2024 Initial Evaluation & HEP created   PATIENT EDUCATION: Education details: seated posture; POC; examination findings Person educated: Patient Education method: Explanation, Demonstration, and Handouts Education comprehension: verbalized understanding, returned demonstration, and needs further education  HOME EXERCISE PROGRAM: Access Code: [email or web address] URL: [email or web address] Date: 01/19/2024 Prepared by: Penelope Bowie  Exercises - Standing Shoulder Row with Anchored Resistance  - 1 x daily - 7 x weekly - 1-2 sets - 10 reps - Shoulder extension with resistance - Neutral  -  1 x daily - 7 x weekly - 1-2 sets - 10 reps - Shoulder External Rotation and Scapular Retraction with Resistance  - 1 x daily - 7 x weekly - 1-2 sets - 10 reps - Flexion-Extension Shoulder Pendulum with Table  Support  - 1 x daily - 7 x weekly - 1 sets - 10 reps - Seated Upper Trapezius Stretch  - 1 x daily - 7 x weekly - 2 sets - 20-30 hold - Seated Cervical Retraction  - 1 x daily - 7 x weekly - 1-2 sets - 10 reps  ASSESSMENT:  CLINICAL IMPRESSION: Patient is a 48 y.o. female who was seen today for physical therapy evaluation and treatment for chronic right shoulder pain. Sevin presents to therapy with right shoulder pain that began a few months ago. She received an injection in March and that provided her minimal relief, but now the effects has completely worn off. She has the most challenge with reaching backwards, reaching into her fridge, and catching falling objections. 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 Based on evaluation noted poor posture, muscle weakness, and increased muscle spasms. Patient is motivated and want to get better. Patient will benefit from skilled PT to address the below impairments and improve overall function.     OBJECTIVE IMPAIRMENTS: decreased ROM, decreased strength, increased muscle spasms, impaired flexibility, impaired sensation, impaired UE functional use, postural dysfunction, and pain.   ACTIVITY LIMITATIONS: carrying, lifting, sleeping, and reach over head  PARTICIPATION LIMITATIONS: meal prep, cleaning, laundry, driving, shopping, and occupation  PERSONAL FACTORS: Fitness, Profession, and Time since onset of injury/illness/exacerbation are also affecting patient's functional outcome.   REHAB POTENTIAL: Good  CLINICAL DECISION MAKING: Stable/uncomplicated  EVALUATION COMPLEXITY: Low  GOALS: Goals reviewed with patient? Yes  SHORT TERM GOALS: Target date: 02/16/2024  Patient will be independent with initial HEP. Baseline:  Goal status: INITIAL  2.  Patient will report > or = to 50% improvement in sleep quality since starting PT. Baseline: waking up 2-3x a night Goal status: INITIAL  3.  Patient will  demonstrate correct seat posture to prevent further neck and shoulder pain/ discomfort. Baseline:  Goal status: INITIAL   LONG TERM GOALS: Target date: 03/15/2024  Patient will demonstrate independence in advanced HEP. Baseline:  Goal status: INITIAL  2.  Patient will be able to reach backwards with minimal discomfort to help with performing ADLs and work tasks. Baseline:  Goal status: INITIAL  3.  Patient will verbalize and demonstrate self-care strategies to manage pain including tissue mobility practices and change of position. Baseline:  Goal status: INITIAL   4.  Patient will score < or = to 19/100  on Quick Dash due to improved functional use of right upper extremity. Baseline: 31.8/100 Goal status: INITIAL  5.  Patient will be able to reach overhead with < or = to 2/10 pain to help with dressing and ADLs. Baseline:  Goal status: INITIAL   PLAN: PT FREQUENCY: 1-2x/week  PT DURATION: 8 weeks  PLANNED INTERVENTIONS: 97164- PT Re-evaluation, 97110-Therapeutic exercises, 97530- Therapeutic activity, [id number]- Neuromuscular re-education, 97535- Self Care, 56213- Manual therapy, [phone]- Aquatic Therapy, [id number]- Electrical stimulation (unattended), [id number]- Electrical stimulation (manual), [id number]- Ultrasound, 41324- Traction (mechanical), [id number]- Ionotophoresis 4mg /ml Dexamethasone, Taping, Dry Needling, Joint mobilization, Joint manipulation, Spinal manipulation, Spinal mobilization, Vestibular training, Cryotherapy, and Moist heat  PLAN FOR NEXT SESSION: Review HEP & correct seated posture at work; UBE/ NuStep;postural strengthening; manual STM right UE; thoracic mobility  Penelope Bowie, PT 01/19/24 2:07 PM [facility] [address], [address] [place], [place] [address] Phone # [phone] Fax [phone]

## 2024-02-02 ENCOUNTER — Ambulatory Visit: Admitting: Physical Therapy

## 2024-02-04 ENCOUNTER — Telehealth: Admitting: Physician Assistant

## 2024-02-04 DIAGNOSIS — T7840XA Allergy, unspecified, initial encounter: Secondary | ICD-10-CM | POA: Diagnosis not present

## 2024-02-04 MED ORDER — PREDNISONE 10 MG (21) PO TBPK: ORAL_TABLET | ORAL | 0 refills | Status: AC

## 2024-02-04 NOTE — Progress Notes (Signed)
 E Visit for Rash  We are sorry that you are not feeling well. Here is how we plan to help!  Based on what you have shared with me, you have had a hypersensitivity reaction to the horsefly bites. I would recommend continuing your OTC antihistamines. I have added on a prescription steroid pack to take as directed to help this resolve more quickly.    GET HELP RIGHT AWAY IF:  Symptoms don't go away after treatment. Severe itching that persists. If you rash spreads or swells. If you rash begins to smell. If it blisters and opens or develops a yellow-brown crust. You develop a fever. You have a sore throat. You become short of breath.  MAKE SURE YOU:  Understand these instructions. Will watch your condition. Will get help right away if you are not doing well or get worse.  Thank you for choosing an e-visit.  Your e-visit answers were reviewed by a board certified advanced clinical practitioner to complete your personal care plan. Depending upon the condition, your plan could have included both over the counter or prescription medications.  Please review your pharmacy choice. Make sure the pharmacy is open so you can pick up prescription now. If there is a problem, you may contact your provider through [employer] and have the prescription routed to another pharmacy.  Your safety is important to us . If you have drug allergies check your prescription carefully.   For the next 24 hours you can use MyChart to ask questions about today's visit, request a non-urgent call back, or ask for a work or school excuse. You will get an email in the next two days asking about your experience. I hope that your e-visit has been valuable and will speed your recovery.

## 2024-02-04 NOTE — Progress Notes (Signed)
 I have spent 5 minutes in review of e-visit questionnaire, review and updating patient chart, medical decision making and response to patient.   Piedad Climes, PA-C

## 2024-02-18 ENCOUNTER — Ambulatory Visit: Admitting: Physical Therapy

## 2024-02-27 ENCOUNTER — Ambulatory Visit: Admitting: Physical Therapy

## 2024-03-04 ENCOUNTER — Ambulatory Visit: Attending: Student | Admitting: Physical Therapy

## 2024-03-04 ENCOUNTER — Telehealth: Payer: Self-pay | Admitting: Physical Therapy

## 2024-03-04 DIAGNOSIS — M25511 Pain in right shoulder: Secondary | ICD-10-CM | POA: Insufficient documentation

## 2024-03-04 DIAGNOSIS — R293 Abnormal posture: Secondary | ICD-10-CM | POA: Insufficient documentation

## 2024-03-04 DIAGNOSIS — M6281 Muscle weakness (generalized): Secondary | ICD-10-CM | POA: Insufficient documentation

## 2024-03-04 DIAGNOSIS — G8929 Other chronic pain: Secondary | ICD-10-CM | POA: Insufficient documentation

## 2024-03-04 NOTE — Telephone Encounter (Signed)
 Spoke with patient about missed appointment today. She got her appointment days mixed up. Patient confirmed next appointment for Monday 7/21 at 8 am.    Kristeen Sar, PT 03/04/24 10:00 AM

## 2024-03-08 ENCOUNTER — Ambulatory Visit: Admitting: Physical Therapy

## 2024-03-08 ENCOUNTER — Encounter: Payer: Self-pay | Admitting: Physical Therapy

## 2024-03-08 DIAGNOSIS — R293 Abnormal posture: Secondary | ICD-10-CM | POA: Diagnosis not present

## 2024-03-08 DIAGNOSIS — M6281 Muscle weakness (generalized): Secondary | ICD-10-CM | POA: Diagnosis not present

## 2024-03-08 DIAGNOSIS — M25511 Pain in right shoulder: Secondary | ICD-10-CM | POA: Diagnosis not present

## 2024-03-08 DIAGNOSIS — G8929 Other chronic pain: Secondary | ICD-10-CM

## 2024-03-08 NOTE — Therapy (Addendum)
 OUTPATIENT PHYSICAL THERAPY UPPER EXTREMITY TREATMENT/ RE-CERTIFICATION/ DISCHARGE NOTE   Patient Name: Emily Irwin MRN: [id number] DOB:[birth year], 48 y.o., female Today's Date: 03/08/2024  END OF SESSION:  PT End of Session - 03/08/24 0948     Visit Number 2    Number of Visits 30    Date for PT Re-Evaluation 05/03/24    Authorization Type [employer] Approved 6 visits-01/19/2024-03/18/2024-auth#[id number]    Authorization Time Period 01/19/2024-03/18/2024    Authorization - Visit Number 2    Authorization - Number of Visits 6    PT Start Time 0802    PT Stop Time 0842    PT Time Calculation (min) 40 min    Activity Tolerance Patient tolerated treatment well    Behavior During Therapy [facility] for tasks assessed/performed           Past Medical History:  Diagnosis Date   Anemia 2007   hemorrhaged during delivery   Diabetes in pregnancy    DIET CONTROLLED   Headache(784.0)    migraines   Hypertension in pregnancy 2007   Past Surgical History:  Procedure Laterality Date   KNEE ARTHROSCOPY  2007   Patient Active Problem List   Diagnosis Date Noted   Cervical radiculitis 08/24/2018   Neck pain 08/24/2018   Axillary lump, right 08/21/2018   Elevated LDL cholesterol level 07/07/2018   Gastroesophageal reflux disease without esophagitis 07/06/2018   Chronic midline low back pain without sciatica 09/09/2016   Lipoma of upper extremity 09/09/2016   Vitamin D  deficiency 09/11/2015   Overweight 09/11/2015   Migraine without aura and with status migrainosus, not intractable 09/08/2015   Gestational diabetes mellitus 07/11/2014   Gestational hypertension 07/11/2014   Obese 07/11/2014    PCP: Antoniette Vermell CROME, PA-C   REFERRING PROVIDER: Emiliano Leonce CROME, PA-C  REFERRING DIAG:  Diagnosis  M25.511,G89.29 (ICD-10-CM) - Chronic right shoulder pain    THERAPY DIAG:  Chronic right shoulder pain  Muscle weakness (generalized)  Abnormal posture  Rationale for  Evaluation and Treatment: Rehabilitation  ONSET DATE: 2 months ago  SUBJECTIVE:                                                                                                                                                                                      SUBJECTIVE STATEMENT: Patient reports the pain has been getting worse. She has been traveling a lot for work. Pain has been 8-9/10. Exercises are going okay. Neck stretch is painful.   From Eval: Patient presents with right shoulder pain that began a few months ago. She got a shoulder injection and she found some relief of symptoms (this was in March 2025), but  now it has worn off. Prior to this she did Advil , ice, heat (Heat feels better for her). Her pain is waking up 2x-3x at night due to pain and after she readjusts she is able to fall back asleep. She is very limited in her ROM and her ability to hold against any resistance. Numbness and tingling is random she does not have it daily. Limiting functional activities: reaching back wards; reaching in fridge/ cabinets; carrying groceries; catching a falling object. Hand dominance: Right  PERTINENT HISTORY: Hx neck pain; lipoma of upper extremity  PAIN:  Are you having pain? Yes: NPRS scale: 1(currently) 6 (worst)/10 Pain location: starts at joint and radiates down triceps and lateral deltoid  Pain description: soreness; sharp; shooting; radiates Aggravating factors: elevation; laying on it at night; full body stretch; reaching backwards while sitting in the car Relieving factors: Heat & Advil ; rest  PRECAUTIONS: None  RED FLAGS: None   WEIGHT BEARING RESTRICTIONS: No  FALLS:  Has patient fallen in last 6 months? No  LIVING ENVIRONMENT: Lives with: lives with their family Lives in: House/apartment Stairs: Yes   OCCUPATION: Accountant (constant computer work)  PLOF: Independent, Independent with basic ADLs, Independent with gait, and Independent with  transfers  PATIENT GOALS: To stop the pain  NEXT MD VISIT: PRN  OBJECTIVE:  Note: Objective measures were completed at Evaluation unless otherwise noted.  DIAGNOSTIC FINDINGS:  Cervical Xray 11/19/2023 IMPRESSION: Mild degenerative joint changes at C5-6.  Right shoulder Xray 11/19/2023 IMPRESSION: Mild glenohumeral joint space narrowing.  FUNCTIONAL TESTS  :  5STS: 9.89 sec    PATIENT SURVEYS :  Quick Dash 31.8/100 31.8% 03/08/2024 QuickDASH :59.1/100   COGNITION: Overall cognitive status: Within functional limits for tasks assessed     SENSATION: Occasional numbness and tingling that is infrequent  POSTURE: Rounded shoulder and forward head  UPPER EXTREMITY ROM: *uncomfortable & weak  Active ROM Right eval Left eval  Shoulder flexion 145* WFL  Shoulder extension    Shoulder abduction 150* WFL  Shoulder adduction    Shoulder internal rotation T12 T5  Shoulder external rotation C5* T2  Elbow flexion    Elbow extension    Wrist flexion    Wrist extension    Wrist ulnar deviation    Wrist radial deviation    Wrist pronation    Wrist supination    (Blank rows = not tested)  UPPER EXTREMITY MMT: 5/5 on left; 4 to 4+ on right no pain    SHOULDER SPECIAL TESTS: Impingement tests: Hawkins/Kennedy impingement test: positive  and Painful arc test: positive  Rotator cuff assessment: Empty can test: negative   PALPATION:  Increased muscle spasms right upper trap                                                                                                                             TREATMENT DATE:  03/08/2024 Discussion of goals, pain levels Manual: STM to right upper trap for improved  muscle elongation & decreased oain Prone shoulder extension, row and abduction x 10 0# x10 1# Seated shoulder ER red TB 2 x 10 Trial of cervical traction. Patient verbalized feeling increased pressure in her head & a migraine so stopped this after 8 mins    01/19/2024  Initial Evaluation & HEP created   PATIENT EDUCATION: Education details: seated posture; POC; examination findings Person educated: Patient Education method: Explanation, Demonstration, and Handouts Education comprehension: verbalized understanding, returned demonstration, and needs further education  HOME EXERCISE PROGRAM: Access Code: [email or web address] URL: [email or web address] Date: 03/08/2024 Prepared by: Kristeen Sar  Exercises - Standing Shoulder Row with Anchored Resistance  - 1 x daily - 7 x weekly - 1-2 sets - 10 reps - Shoulder extension with resistance - Neutral  - 1 x daily - 7 x weekly - 1-2 sets - 10 reps - Shoulder External Rotation and Scapular Retraction with Resistance  - 1 x daily - 7 x weekly - 1-2 sets - 10 reps - Flexion-Extension Shoulder Pendulum with Table Support  - 1 x daily - 7 x weekly - 1 sets - 10 reps - Seated Cervical Retraction  - 1 x daily - 7 x weekly - 1-2 sets - 5 reps - Prone Shoulder Row  - 1 x daily - 7 x weekly - 2 sets - 10 reps - Prone Elbow Extension with Dumbbell  - 1 x daily - 7 x weekly - 2 sets - 10 reps - Prone Single Arm Shoulder Horizontal Abduction with Dumbbell  - 1 x daily - 7 x weekly - 2 sets - 10 reps  ASSESSMENT:  CLINICAL IMPRESSION: Re-certification completed today. Kassandra has not returned to the clinic since her evaluation due to traveling for work. No goals have been met due to this. Patient verbalized that her pain has been getting worse since she was last in the clinic. She verbalized semi-compliance with home exercises. Discontinued some of the exercises due to patient verbalizing increased pain. Patient responded well to manual techniques targeting right upper trap. Noted increased muscle spasms and tenderness in the area. Introduced prone shoulder strengthening exercises for improved posture and shoulder strength. Educated patient on the potential causes of her pain and how we can address them with physical  therapy. Discussed the potential benefit of returning to the doctor if she feels her shoulder pain is not getting any better. Initiated a trial of traction to target radiating symptoms down right arm, but patient verbalized increased pressure in her head during treatment so discontinues. Patient would benefit from another 8 weeks of therapy to address pain, improve posture, and    OBJECTIVE IMPAIRMENTS: decreased ROM, decreased strength, increased muscle spasms, impaired flexibility, impaired sensation, impaired UE functional use, postural dysfunction, and pain.   ACTIVITY LIMITATIONS: carrying, lifting, sleeping, and reach over head  PARTICIPATION LIMITATIONS: meal prep, cleaning, laundry, driving, shopping, and occupation  PERSONAL FACTORS: Fitness, Profession, and Time since onset of injury/illness/exacerbation are also affecting patient's functional outcome.   REHAB POTENTIAL: Good  CLINICAL DECISION MAKING: Stable/uncomplicated  EVALUATION COMPLEXITY: Low  GOALS: Goals reviewed with patient? Yes  SHORT TERM GOALS: Target date: 02/16/2024  Patient will be independent with initial HEP. Baseline:  Goal status: MET 03/08/2024  2.  Patient will report > or = to 50% improvement in sleep quality since starting PT. Baseline: waking up 2-3x a night Goal status: IN PROGRESS 03/08/2024  3.  Patient will demonstrate correct seat posture to prevent further neck and shoulder pain/ discomfort. Baseline:  Goal status: IN PROGRESS 03/08/2024   LONG TERM GOALS: Target date: 05/03/2024  Patient will demonstrate independence in advanced HEP. Baseline:  Goal status: IN PROGRESS 03/08/2024  2.  Patient will be able to reach backwards with minimal discomfort to help with performing ADLs and work tasks. Baseline:  Goal status: IN PROGRESS 03/08/2024  3.  Patient will verbalize and demonstrate self-care strategies to manage pain including tissue mobility practices and change of position. Baseline:   Goal status: IN PROGRESS 03/08/2024   4.  Patient will score < or = to 19/100  on Quick Dash due to improved functional use of right upper extremity. Baseline: 31.8/100 Goal status:IN PROGRESS 03/08/2024  5.  Patient will be able to reach overhead with < or = to 2/10 pain to help with dressing and ADLs. Baseline:  Goal status: IN PROGRESS 03/08/2024   PLAN: PT FREQUENCY: 1-2x/week  PT DURATION: 8 weeks  PLANNED INTERVENTIONS: 97164- PT Re-evaluation, 97110-Therapeutic exercises, 97530- Therapeutic activity, 97112- Neuromuscular re-education, 97535- Self Care, 02859- Manual therapy, [phone]- Aquatic Therapy, [phone]- Electrical stimulation (unattended), [phone]- Electrical stimulation (manual), [id number]- Ultrasound, 02987- Traction (mechanical), [id number]- Ionotophoresis 4mg /ml Dexamethasone, Taping, Dry Needling, Joint mobilization, Joint manipulation, Spinal manipulation, Spinal mobilization, Vestibular training, Cryotherapy, and Moist heat  PLAN FOR NEXT SESSION: assess updated HEP; try traction again or ionto patch;  posture at work; postural strengthening; manual STM right UE; thoracic mobility   Kristeen Sar, PT 03/08/24 9:50 AM [facility] [address], [address] [place], [place] [address] Phone # [phone] Fax [phone]  PHYSICAL THERAPY DISCHARGE SUMMARY  Visits from Start of Care: 2  Patient is being discharged due to patient request because she has a high deductible.   Patient agrees to discharge. Patient goals were not met. Patient is being discharged due to not returning since the last visit.

## 2024-03-30 ENCOUNTER — Ambulatory Visit

## 2024-04-06 ENCOUNTER — Encounter

## 2024-06-21 ENCOUNTER — Encounter: Payer: Self-pay | Admitting: Radiology

## 2024-08-04 ENCOUNTER — Other Ambulatory Visit: Payer: Self-pay | Admitting: Physician Assistant

## 2024-08-04 DIAGNOSIS — Z1231 Encounter for screening mammogram for malignant neoplasm of breast: Secondary | ICD-10-CM

## 2024-08-20 ENCOUNTER — Ambulatory Visit
Admission: RE | Admit: 2024-08-20 | Discharge: 2024-08-20 | Disposition: A | Discharge status: Home / Self Care | Source: Ambulatory Visit | Attending: Physician Assistant | Admitting: Physician Assistant

## 2024-08-20 DIAGNOSIS — Z1231 Encounter for screening mammogram for malignant neoplasm of breast: Secondary | ICD-10-CM

## 2024-08-24 ENCOUNTER — Ambulatory Visit: Payer: Self-pay | Admitting: Physician Assistant

## 2024-08-24 NOTE — Progress Notes (Signed)
 Normal mammogram. Follow up in 1 year.
